# Patient Record
Sex: Male | Born: 1940 | Race: White | Hispanic: No | Smoking: Former smoker
Health system: Southern US, Community
[De-identification: ages and names within clinical notes are randomized; demographics above are authoritative.]

## PROBLEM LIST (undated history)

## (undated) DIAGNOSIS — M1990 Unspecified osteoarthritis, unspecified site: Secondary | ICD-10-CM

## (undated) DIAGNOSIS — J45909 Unspecified asthma, uncomplicated: Secondary | ICD-10-CM

## (undated) DIAGNOSIS — F419 Anxiety disorder, unspecified: Secondary | ICD-10-CM

## (undated) DIAGNOSIS — N529 Male erectile dysfunction, unspecified: Secondary | ICD-10-CM

## (undated) DIAGNOSIS — T7840XA Allergy, unspecified, initial encounter: Secondary | ICD-10-CM

## (undated) DIAGNOSIS — F32A Depression, unspecified: Secondary | ICD-10-CM

## (undated) DIAGNOSIS — N200 Calculus of kidney: Secondary | ICD-10-CM

## (undated) DIAGNOSIS — I10 Essential (primary) hypertension: Secondary | ICD-10-CM

## (undated) DIAGNOSIS — J449 Chronic obstructive pulmonary disease, unspecified: Secondary | ICD-10-CM

## (undated) DIAGNOSIS — H9190 Unspecified hearing loss, unspecified ear: Secondary | ICD-10-CM

## (undated) DIAGNOSIS — N189 Chronic kidney disease, unspecified: Secondary | ICD-10-CM

## (undated) DIAGNOSIS — F329 Major depressive disorder, single episode, unspecified: Secondary | ICD-10-CM

## (undated) HISTORY — DX: Essential (primary) hypertension: I10

## (undated) HISTORY — DX: Unspecified asthma, uncomplicated: J45.909

## (undated) HISTORY — DX: Calculus of kidney: N20.0

## (undated) HISTORY — PX: COLONOSCOPY: SHX174

## (undated) HISTORY — DX: Allergy, unspecified, initial encounter: T78.40XA

## (undated) HISTORY — DX: Major depressive disorder, single episode, unspecified: F32.9

## (undated) HISTORY — PX: TONSILLECTOMY: SUR1361

## (undated) HISTORY — DX: Anxiety disorder, unspecified: F41.9

## (undated) HISTORY — PX: POLYPECTOMY: SHX149

## (undated) HISTORY — DX: Depression, unspecified: F32.A

## (undated) HISTORY — DX: Unspecified hearing loss, unspecified ear: H91.90

## (undated) HISTORY — DX: Male erectile dysfunction, unspecified: N52.9

## (undated) HISTORY — DX: Unspecified osteoarthritis, unspecified site: M19.90

---

## 2009-09-13 ENCOUNTER — Emergency Department (HOSPITAL_COMMUNITY): Admission: EM | Admit: 2009-09-13 | Discharge: 2009-09-14 | Payer: Self-pay | Admitting: Emergency Medicine

## 2009-09-20 ENCOUNTER — Emergency Department (HOSPITAL_COMMUNITY): Admission: EM | Admit: 2009-09-20 | Discharge: 2009-09-20 | Payer: Self-pay | Admitting: Emergency Medicine

## 2009-10-03 ENCOUNTER — Emergency Department (HOSPITAL_COMMUNITY): Admission: EM | Admit: 2009-10-03 | Discharge: 2009-10-03 | Payer: Self-pay | Admitting: Emergency Medicine

## 2009-10-07 ENCOUNTER — Ambulatory Visit: Payer: Self-pay | Admitting: Family Medicine

## 2009-10-07 DIAGNOSIS — I10 Essential (primary) hypertension: Secondary | ICD-10-CM

## 2009-10-07 HISTORY — DX: Essential (primary) hypertension: I10

## 2009-10-07 LAB — CONVERTED CEMR LAB
AST: 26 units/L (ref 0–37)
Albumin: 4.8 g/dL (ref 3.5–5.2)
Alkaline Phosphatase: 60 units/L (ref 39–117)
BUN: 24 mg/dL — ABNORMAL HIGH (ref 6–23)
Basophils Absolute: 0 10*3/uL (ref 0.0–0.1)
Basophils Relative: 0.4 % (ref 0.0–3.0)
Bilirubin Urine: NEGATIVE
Bilirubin, Direct: 0.2 mg/dL (ref 0.0–0.3)
Calcium: 10.1 mg/dL (ref 8.4–10.5)
Cholesterol: 113 mg/dL (ref 0–200)
GFR calc non Af Amer: 52.91 mL/min (ref >60)
Glucose, Urine, Semiquant: NEGATIVE
HDL: 40.9 mg/dL (ref >39.00)
Hemoglobin: 17.3 g/dL — ABNORMAL HIGH (ref 13.0–17.0)
Ketones, urine, test strip: NEGATIVE
Monocytes Absolute: 0.6 10*3/uL (ref 0.1–1.0)
Monocytes Relative: 5.4 % (ref 3.0–12.0)
Neutro Abs: 8.1 10*3/uL — ABNORMAL HIGH (ref 1.4–7.7)
Protein, U semiquant: NEGATIVE
RDW: 13.6 % (ref 11.5–14.6)
Specific Gravity, Urine: 1.025
TSH: 8.35 microintl units/mL — ABNORMAL HIGH (ref 0.35–5.50)
Total Bilirubin: 1.1 mg/dL (ref 0.3–1.2)
Total CHOL/HDL Ratio: 3
Triglycerides: 109 mg/dL (ref 0.0–149.0)
VLDL: 21.8 mg/dL (ref 0.0–40.0)

## 2009-10-13 ENCOUNTER — Telehealth: Payer: Self-pay | Admitting: Family Medicine

## 2009-10-13 ENCOUNTER — Ambulatory Visit: Payer: Self-pay | Admitting: Family Medicine

## 2009-10-14 ENCOUNTER — Telehealth: Payer: Self-pay | Admitting: Family Medicine

## 2009-10-15 ENCOUNTER — Ambulatory Visit: Payer: Self-pay | Admitting: Family Medicine

## 2009-10-21 ENCOUNTER — Ambulatory Visit: Payer: Self-pay | Admitting: Internal Medicine

## 2009-10-27 ENCOUNTER — Encounter (INDEPENDENT_AMBULATORY_CARE_PROVIDER_SITE_OTHER): Payer: Self-pay | Admitting: *Deleted

## 2009-11-01 ENCOUNTER — Ambulatory Visit: Payer: Self-pay | Admitting: Gastroenterology

## 2009-11-19 ENCOUNTER — Ambulatory Visit: Payer: Self-pay | Admitting: Gastroenterology

## 2009-11-24 ENCOUNTER — Encounter: Payer: Self-pay | Admitting: Gastroenterology

## 2009-12-30 ENCOUNTER — Ambulatory Visit: Payer: Self-pay | Admitting: Family Medicine

## 2009-12-30 DIAGNOSIS — N529 Male erectile dysfunction, unspecified: Secondary | ICD-10-CM | POA: Insufficient documentation

## 2009-12-30 HISTORY — DX: Male erectile dysfunction, unspecified: N52.9

## 2010-04-26 NOTE — Assessment & Plan Note (Signed)
Summary: fu on bp per doc todd/njr   Vital Signs:  Neal profile:   70 year old male Weight:      191 pounds Temp:     97.9 degrees F oral BP sitting:   120 / 70  (left arm) Cuff size:   regular  Vitals Entered By: Duard Brady LPN (October 21, 2009 4:23 PM) CC: f/u on BP Is Neal Diabetic? No   CC:  f/u on BP.  History of Present Illness: Mitchell Neal who is seen today for follow-up of his hypertension.  He was seen earlier with accelerated hypertension and now is on multiple medications.  He has been monitoring  blood pressures closely at home with the excellent blood pressure control.  blood pressure readings.  This past week, and oriented low normal range.  During that time.  He occasionally felt fatigued, but had no significant orthostatic symptoms.  For the past few days he has felt well. He is maintained excellent blood pressure control.  He denies any cardiopulmonary complaints.  He is asking about the possibility of down titrating his medications  Allergies (verified): No Known Drug Allergies  Past History:  Past Medical History: Reviewed history from 10/07/2009 and no changes required. Hypertension SK, hx of kidney stones  Physical Exam  General:  Well-developed,well-nourished,in no acute distress; alert,appropriate and cooperative throughout examination; 136/80 Head:  Normocephalic and atraumatic without obvious abnormalities. No apparent alopecia or balding. Eyes:  No corneal or conjunctival inflammation noted. EOMI. Perrla. Funduscopic exam benign, without hemorrhages, exudates or papilledema. Vision grossly normal. Neck:  No deformities, masses, or tenderness noted. Lungs:  Normal respiratory effort, chest expands symmetrically. Lungs are clear to auscultation, no crackles or wheezes. Heart:  Normal rate and regular rhythm. S1 and S2 normal without gallop, murmur, click, rub or other extra sounds.   Impression & Recommendations:  Problem # 1:   HYPERTENSION (ICD-401.9)  His updated medication list for this problem includes:    Hydrochlorothiazide 25 Mg Tabs (Hydrochlorothiazide) .Marland Kitchen... Take one tab by mouth once daily    Labetalol Hcl 200 Mg Tabs (Labetalol hcl) .Marland Kitchen... Take 2  tablet by mouth two times a day    Norvasc 10 Mg Tabs (Amlodipine besylate) .Marland Kitchen... Take 1 tablet by mouth every morning    Cozaar 50 Mg Tabs (Losartan potassium) .Marland Kitchen... Take 1 tablet by mouth every morning  Complete Medication List: 1)  Hydrochlorothiazide 25 Mg Tabs (Hydrochlorothiazide) .... Take one tab by mouth once daily 2)  Vitamin C 250 Mg Tabs (Ascorbic acid) .... Take one tab by mouth once daily 3)  Qc Mens Daily Multivitamin Tabs (Multiple vitamins-minerals) .... Take one tab by mouth once daily 4)  Labetalol Hcl 200 Mg Tabs (Labetalol hcl) .... Take 2  tablet by mouth two times a day 5)  Norvasc 10 Mg Tabs (Amlodipine besylate) .... Take 1 tablet by mouth every morning 6)  Cozaar 50 Mg Tabs (Losartan potassium) .... Take 1 tablet by mouth every morning  Neal Instructions: 1)  Please schedule a follow-up appointment in 2 months. 2)  Limit your Sodium (Salt). 3)  It is important that you exercise regularly at least 20 minutes 5 times a week. If you develop chest pain, have severe difficulty breathing, or feel very tired , stop exercising immediately and seek medical attention. 4)  You need to lose weight. Consider a lower calorie diet and regular exercise.  5)  Check your Blood Pressure regularly. If it is above: 160/90 or lower than 100/65  you should make an appointment.

## 2010-04-26 NOTE — Assessment & Plan Note (Signed)
Summary: BRAND NEW PT/ TO EST/CJR   Vital Signs:  Patient profile:   70 year old male Height:      69.5 inches Weight:      191 pounds BMI:     27.90 Temp:     98.6 degrees F oral BP sitting:   190 / 110  (left arm) Cuff size:   regular  Vitals Entered By: Kern Reap CMA Duncan Dull) (October 07, 2009 8:39 AM) CC: new to establish, HTN Is Patient Diabetic? No Pain Assessment Patient in pain? no        CC:  new to establish and HTN.  History of Present Illness: Osborne is a 70 year old single male ex smoker x 4 weeks and comes in today on referral from the emergency room.  Dr. Ignacia Palma for evaluation of hypertension.  He states he went to the emergency room on June 20 with severe back pain.  He was diagnosed to have a kidney stone, which he passed spontaneously.  They also noted.  His blood pressure was elevated and start him on labetalol 100 mg b.i.d. and Inocor thiazide 25 mg daily.  BP after 3 weeks of therapy 190/110.... obviously not at goal.  Review of systems negative.  Last tetanus booster unknown  Preventive Screening-Counseling & Management      Drug Use:  no.    Allergies (verified): No Known Drug Allergies  Past History:  Past medical, surgical, family and social histories (including risk factors) reviewed, and no changes noted (except as noted below).  Past Medical History: Hypertension SK, hx of kidney stones  Past Surgical History: Tonsillectomy  Family History: Reviewed history and no changes required. Father: deceased....88.....smoker Mother: deceased..........101 Siblings: -2.sisters ok  Social History: Reviewed history and no changes required. Occupation: Contractor Divorced Alcohol use-no Drug use-no Drug Use:  no  Review of Systems      See HPI  Physical Exam  General:  Well-developed,well-nourished,in no acute distress; alert,appropriate and cooperative throughout examination Head:  Normocephalic and atraumatic without obvious  abnormalities. No apparent alopecia or balding. Eyes:  No corneal or conjunctival inflammation noted. EOMI. Perrla. Funduscopic exam benign, without hemorrhages, exudates or papilledema. Vision grossly normal. Ears:  External ear exam shows no significant lesions or deformities.  Otoscopic examination reveals clear canals, tympanic membranes are intact bilaterally without bulging, retraction, inflammation or discharge. Hearing is grossly normal bilaterally. Nose:  External nasal examination shows no deformity or inflammation. Nasal mucosa are pink and moist without lesions or exudates. Mouth:  Oral mucosa and oropharynx without lesions or exudates.  Teeth in good repair. Neck:  No deformities, masses, or tenderness noted. Chest Wall:  No deformities, masses, tenderness or gynecomastia noted. Breasts:  No masses or gynecomastia noted Lungs:  Normal respiratory effort, chest expands symmetrically. Lungs are clear to auscultation, no crackles or wheezes. Heart:  Normal rate and regular rhythm. S1 and S2 normal without gallop, murmur, click, rub or other extra sounds. Abdomen:  Bowel sounds positive,abdomen soft and non-tender without masses, organomegaly or hernias noted..........ventral hernia from the umbilicus to the xiphoid.  2 inches in gap Rectal:  No external abnormalities noted. Normal sphincter tone. No rectal masses or tenderness. Genitalia:  Testes bilaterally descended without nodularity, tenderness or masses. No scrotal masses or lesions. No penis lesions or urethral discharge. Prostate:  Prostate gland firm and smooth, no enlargement, nodularity, tenderness, mass, asymmetry or induration. Msk:  No deformity or scoliosis noted of thoracic or lumbar spine.   Pulses:  R and L carotid,radial,femoral,dorsalis pedis  and posterior tibial pulses are full and equal bilaterally Extremities:  No clubbing, cyanosis, edema, or deformity noted with normal full range of motion of all joints.     Neurologic:  No cranial nerve deficits noted. Station and gait are normal. Plantar reflexes are down-going bilaterally. DTRs are symmetrical throughout. Sensory, motor and coordinative functions appear intact. Skin:  Intact without suspicious lesions or rashes Cervical Nodes:  No lymphadenopathy noted Axillary Nodes:  No palpable lymphadenopathy Inguinal Nodes:  No significant adenopathy Psych:  Cognition and judgment appear intact. Alert and cooperative with normal attention span and concentration. No apparent delusions, illusions, hallucinations   Impression & Recommendations:  Problem # 1:  HYPERTENSION (ICD-401.9) Assessment New  His updated medication list for this problem includes:    Hydrochlorothiazide 25 Mg Tabs (Hydrochlorothiazide) .Marland Kitchen... Take one tab by mouth once daily    Norvasc 5 Mg Tabs (Amlodipine besylate) .Marland Kitchen... Take 1 tablet by mouth every morning    Labetalol Hcl 200 Mg Tabs (Labetalol hcl) .Marland Kitchen... Take 1 tablet by mouth two times a day  Orders: Venipuncture (60454) Prescription Created Electronically [id number]) UA Dipstick w/o Micro (automated)  (81003) EKG w/ Interpretation (93000) TLB-Lipid Panel (80061-LIPID) TLB-BMP (Basic Metabolic Panel-BMET) (80048-METABOL) TLB-CBC Platelet - w/Differential (85025-CBCD) TLB-Hepatic/Liver Function Pnl (80076-HEPATIC) TLB-TSH (Thyroid Stimulating Hormone) (84443-TSH)  Problem # 2:  Preventive Health Care (ICD-V70.0) Assessment: New  Complete Medication List: 1)  Hydrochlorothiazide 25 Mg Tabs (Hydrochlorothiazide) .... Take one tab by mouth once daily 2)  Vitamin C 250 Mg Tabs (Ascorbic acid) .... Take one tab by mouth once daily 3)  Qc Mens Daily Multivitamin Tabs (Multiple vitamins-minerals) .... Take one tab by mouth once daily 4)  Norvasc 5 Mg Tabs (Amlodipine besylate) .... Take 1 tablet by mouth every morning 5)  Labetalol Hcl 200 Mg Tabs (Labetalol hcl) .... Take 1 tablet by mouth two times a day  Other Orders: TD  Toxoids IM 7 YR + (91478) Admin 1st Vaccine (29562) Gastroenterology Referral (GI)  Patient Instructions: 1)  increase the labetalol to two tabs twice daily, continue the HCTZ, 25 mg q.a.m., add Norvasc 5 mg q.a.m. 2)  Check a BP daily.  Return in one week with all your blood pressure readings and the device 3)  Schedule a colonoscopy/sigmoidoscopy to help detect colon cancer. 4)  Take an Aspirin every day. Prescriptions: HYDROCHLOROTHIAZIDE 25 MG TABS (HYDROCHLOROTHIAZIDE) take one tab by mouth once daily  #100 x 3   Entered and Authorized by:   Roderick Pee MD   Signed by:   Roderick Pee MD on 10/07/2009   Method used:   Electronically to        [id number]* (retail)       [address]       [place], [place]  [address]       Ph: [phone] or [phone]       Fax: [phone]   RxID:   [id number] LABETALOL HCL 200 MG TABS (LABETALOL HCL) Take 1 tablet by mouth two times a day  #200 x 3   Entered and Authorized by:   Roderick Pee MD   Signed by:   Roderick Pee MD on 10/07/2009   Method used:   Electronically to        [facility]  [address] [id number]* (retail)       [address]       [facility]  [place], [place]  [address]       Ph: [phone] or [phone]       Fax: [phone]   RxID:   [id number] NORVASC 5 MG TABS (AMLODIPINE BESYLATE) Take 1 tablet by mouth every morning  #100 x 3   Entered and Authorized by:   Roderick Pee MD   Signed by:   Roderick Pee MD on 10/07/2009   Method used:   Electronically to        [id number]* (retail)       [address]       [place], [place]  [address]       Ph: [phone] or [phone]       Fax: [phone]   RxID:   [id number]   Laboratory Results   Urine Tests    Routine Urinalysis   Color: yellow Appearance: Clear Glucose: negative   (Normal Range: Negative) Bilirubin: negative   (Normal Range:  Negative) Ketone: negative   (Normal Range: Negative) Spec. Gravity: 1.025   (Normal Range: 1.003-1.035) Blood: negative   (Normal Range: Negative) pH: 6.0   (Normal Range: 5.0-8.0) Protein: negative   (Normal Range: Negative) Urobilinogen: 0.2   (Normal Range: 0-1) Nitrite: negative   (Normal Range: Negative) Leukocyte Esterace: negative   (Normal Range: Negative)    Comments: Rita Ohara  October 07, 2009 10:03 AM       Immunizations Administered:  Tetanus Vaccine:    Vaccine Type: Td    Site: left deltoid    Mfr: Sanofi Pasteur    Dose: 0.5 ml    Route: IM    Given by: Kern Reap CMA (AAMA)    Exp. Date: 04/22/2011    Lot #: [id number]    Physician counseled: yes

## 2010-04-26 NOTE — Assessment & Plan Note (Signed)
Summary: fu friday/njr   Vital Signs:  Patient profile:   70 year old male BP sitting:   130 / 72  (left arm) Cuff size:   regular  Vitals Entered By: Kern Reap CMA Duncan Dull) (October 15, 2009 12:11 PM)  History of Present Illness: Mitchell Neal is a 70 year old male, who comes back today for follow-up of severe hypertension.  We saw him on July 14 with market.  Elevation of his blood pressure 200/120.  We placed and a complete bed rest, increased his Norvasc to 10 mg a day, increased his labetalol to 600 mg b.i.d. added 50 mg a Cozaar daily.  He continues to take the diuretic one daily.  BP today, now 130/72.  No hypotension.  He is not lightheaded when he stands up.  Allergies: No Known Drug Allergies  Past History:  Past medical, surgical, family and social histories (including risk factors) reviewed for relevance to current acute and chronic problems.  Past Medical History: Reviewed history from 10/07/2009 and no changes required. Hypertension SK, hx of kidney stones  Past Surgical History: Reviewed history from 10/07/2009 and no changes required. Tonsillectomy  Family History: Reviewed history from 10/07/2009 and no changes required. Father: deceased....88.....smoker Mother: deceased..........101 Siblings: -2.sisters ok  Social History: Reviewed history from 10/07/2009 and no changes required. Occupation: Contractor Divorced Alcohol use-no Drug use-no  Review of Systems      See HPI  Physical Exam  General:  Well-developed,well-nourished,in no acute distress; alert,appropriate and cooperative throughout examination Heart:  130/80   Impression & Recommendations:  Problem # 1:  HYPERTENSION (ICD-401.9) Assessment Improved  The following medications were removed from the medication list:    Norvasc 5 Mg Tabs (Amlodipine besylate) .Marland Kitchen... Take 1 tablet by mouth every morning His updated medication list for this problem includes:    Hydrochlorothiazide 25 Mg Tabs  (Hydrochlorothiazide) .Marland Kitchen... Take one tab by mouth once daily    Labetalol Hcl 200 Mg Tabs (Labetalol hcl) .Marland Kitchen... Take 3  tablet by mouth two times a day    Norvasc 10 Mg Tabs (Amlodipine besylate) .Marland Kitchen... Take 1 tablet by mouth every morning    Cozaar 50 Mg Tabs (Losartan potassium) .Marland Kitchen... Take 1 tablet by mouth every morning  Orders: Prescription Created Electronically [id number])  Complete Medication List: 1)  Hydrochlorothiazide 25 Mg Tabs (Hydrochlorothiazide) .... Take one tab by mouth once daily 2)  Vitamin C 250 Mg Tabs (Ascorbic acid) .... Take one tab by mouth once daily 3)  Qc Mens Daily Multivitamin Tabs (Multiple vitamins-minerals) .... Take one tab by mouth once daily 4)  Labetalol Hcl 200 Mg Tabs (Labetalol hcl) .... Take 3  tablet by mouth two times a day 5)  Norvasc 10 Mg Tabs (Amlodipine besylate) .... Take 1 tablet by mouth every morning 6)  Cozaar 50 Mg Tabs (Losartan potassium) .... Take 1 tablet by mouth every morning  Patient Instructions: 1)  continued to take it your current medications daily.  Ambulate and resume y  normal activities.  Check a morning blood pressure daily.  Return to see Dr. Kirtland Bouchard. next Thursday afternoon for follow-up Prescriptions: HYDROCHLOROTHIAZIDE 25 MG TABS (HYDROCHLOROTHIAZIDE) take one tab by mouth once daily  #100 x 3   Entered and Authorized by:   Roderick Pee MD   Signed by:   Roderick Pee MD on 10/15/2009   Method used:   Electronically to        [facility]  [address] [id number]* (retail)  [address]       [place], [place]  [address]       Ph: [phone] or [phone]       Fax: [phone]   RxID:   [id number]

## 2010-04-26 NOTE — Progress Notes (Signed)
Summary: Elevated BP  Phone Note Call from Patient Call back at [facility] Phone [phone]   Caller: Patient Summary of Call: This morning woke up with a lot of weakness in legs, not feeling dizzy.  BP reading was 196/88.  Please have nurse call me back. Initial call taken by: Trixie Dredge,  October 13, 2009 9:10 AM  Follow-up for Phone Call        patient coming in for office visit Follow-up by: Kern Reap CMA Duncan Dull),  October 13, 2009 9:29 AM

## 2010-04-26 NOTE — Assessment & Plan Note (Signed)
Summary: elevated blood pressure - rv   Vital Signs:  Patient profile:   70 year old male Weight:      191 pounds Temp:     98.3 degrees F oral BP sitting:   200 / 100  (left arm) Cuff size:   regular  Vitals Entered By: Kern Reap CMA Duncan Dull) (October 13, 2009 10:05 AM) CC: elevated blood pressure   CC:  elevated blood pressure.  History of Present Illness: Mitchell Neal is a 70 year old male, who comes in today for evaluation of hypertension.  We saw him a week or two ago, and increased his beta blocker to 4 mg b.i.d. however, his blood pressure has not dropped at all.  Today is 190/90 right arm sitting position.  Is asymptomatic.  Labs done couple weeks ago, normal except for GFR 52  Allergies: No Known Drug Allergies PMH-FH-SH reviewed for relevance  Review of Systems      See HPI  Physical Exam  General:  Well-developed,well-nourished,in no acute distress; alert,appropriate and cooperative throughout examination Heart:  190/90 right arm sitting position   Impression & Recommendations:  Problem # 1:  HYPERTENSION (ICD-401.9) Assessment Unchanged  His updated medication list for this problem includes:    Hydrochlorothiazide 25 Mg Tabs (Hydrochlorothiazide) .Marland Kitchen... Take one tab by mouth once daily    Norvasc 5 Mg Tabs (Amlodipine besylate) .Marland Kitchen... Take 1 tablet by mouth every morning    Labetalol Hcl 200 Mg Tabs (Labetalol hcl) .Marland Kitchen... Take 3  tablet by mouth two times a day    Norvasc 10 Mg Tabs (Amlodipine besylate) .Marland Kitchen... Take 1 tablet by mouth every morning    Cozaar 50 Mg Tabs (Losartan potassium) .Marland Kitchen... Take 1 tablet by mouth every morning  Orders: Prescription Created Electronically [phone])  Complete Medication List: 1)  Hydrochlorothiazide 25 Mg Tabs (Hydrochlorothiazide) .... Take one tab by mouth once daily 2)  Vitamin C 250 Mg Tabs (Ascorbic acid) .... Take one tab by mouth once daily 3)  Qc Mens Daily Multivitamin Tabs (Multiple vitamins-minerals) .... Take one  tab by mouth once daily 4)  Norvasc 5 Mg Tabs (Amlodipine besylate) .... Take 1 tablet by mouth every morning 5)  Labetalol Hcl 200 Mg Tabs (Labetalol hcl) .... Take 3  tablet by mouth two times a day 6)  Norvasc 10 Mg Tabs (Amlodipine besylate) .... Take 1 tablet by mouth every morning 7)  Cozaar 50 Mg Tabs (Losartan potassium) .... Take 1 tablet by mouth every morning  Patient Instructions: 1)  increase the Norvasc to 10 mg daily, increase the labetalol to 600 mg twice daily, add Cozaar 50 mg daily, and continue the hydrochlorothiazide 25 mg in the morning. 2)  Stay at complete bed rest all day today, and all day tomorrow.  Return on Friday for follow-up.  When you return on Friday bring a record of all your blood pressure readings 3 times a day 3)  also remember no salt and drink 30 ounces of water daily Prescriptions: COZAAR 50 MG TABS (LOSARTAN POTASSIUM) Take 1 tablet by mouth every morning  #30 x 2   Entered and Authorized by:   Roderick Pee MD   Signed by:   Roderick Pee MD on 10/13/2009   Method used:   Electronically to        [facility]  [address] [id number]* (retail)       [address]       [facility] [place], [place]  [address]       Ph: [phone] or [phone]       Fax: [phone]   RxID:   [id number] NORVASC 10 MG TABS (AMLODIPINE BESYLATE) Take 1 tablet by mouth every morning  #100 x 3   Entered and Authorized by:   Roderick Pee MD   Signed by:   Roderick Pee MD on 10/13/2009   Method used:   Electronically to        [facility]  [address] [id number]* (retail)       [address]       [place], [place]  [address]       Ph: [phone] or [phone]       Fax: [phone]   RxID:   [id number] LABETALOL HCL 200 MG TABS (LABETALOL HCL) Take 3  tablet by mouth two times a day  #600 x 3   Entered and Authorized by:   Roderick Pee MD   Signed by:   Roderick Pee MD on 10/13/2009   Method used:   Electronically to         [facility]  [address] [id number]* (retail)       [address]       [place], [place]  [address]       Ph: [phone] or [phone]       Fax: [phone]   RxID:   [id number]

## 2010-04-26 NOTE — Letter (Signed)
Summary: Patient Notice- Polyp Results  [facility]  [address] [place], [place] [address]   Phone: [phone]  Fax: [phone]        November 24, 2009 MRN: [id number]    MACARIO SHEAR [address] [place], [place]  [address]    Dear Mr. CHRISTIANA,  I am pleased to inform you that the colon polyp(s) removed during your recent colonoscopy was (were) found to be benign (no cancer detected) upon pathologic examination.  I recommend you have a repeat colonoscopy examination in 3_ years to look for recurrent polyps, as having colon polyps increases your risk for having recurrent polyps or even colon cancer in the future.  Should you develop new or worsening symptoms of abdominal pain, bowel habit changes or bleeding from the rectum or bowels, please schedule an evaluation with either your primary care physician or with me.  Additional information/recommendations:  X__ No further action with gastroenterology is needed at this time. Please      follow-up with your primary care physician for your other healthcare      needs.  __ Please call [phone] to schedule a return visit to review your      situation.  __ Please keep your follow-up visit as already scheduled.  __ Continue treatment plan as outlined the day of your exam.  Please call us if you are having persistent problems or have questions about your condition that have not been fully answered at this time.  Sincerely,  Mardella Layman MD [facility]  This letter has been electronically signed by your physician.  Appended Document: Patient Notice- Polyp Results letter mailed 9.2.11

## 2010-04-26 NOTE — Progress Notes (Signed)
Summary: Phone call  Phone Note Outgoing Call   Call placed by: Kathrynn Speed CMA,  October 14, 2009 4:31 PM Summary of Call: Called pt to see why he did not show up for his apt, he said this apt was to be cancelled; because he is coming in the office [place].  Checked apt is [place] at 12:30pm Initial call taken by: Kathrynn Speed CMA,  October 14, 2009 4:31 PM

## 2010-04-26 NOTE — Assessment & Plan Note (Signed)
Summary: follow up bp and med/discuss colonoscopy results/cjr   Vital Signs:  Patient profile:   70 year old male Weight:      196 pounds Temp:     98.3 degrees F oral BP sitting:   130 / 78  (left arm) Cuff size:   regular  Vitals Entered By: Kern Reap CMA Duncan Dull) (December 30, 2009 10:47 AM) CC: follow-up visit   CC:  follow-up visit.  History of Present Illness: Mitchell Neal is a 70 year old male, who comes in today for evaluation of two problems.  His blood pressure is too low on his medications.  He takes hydrochlorothiazide 25 mg q.a.m., Norvasc, 10 mg q.a.m., Cozaar, 50 mg q.a.m., labetalol 4 mg b.i.d.  Blood pressure home is averaging 120/60 in the morning.  He has an hour or two where he is lightheaded.  He will also like some Viagra.  He is having trouble with erectile dysfunction.  He is a nonsmoker  Allergies: No Known Drug Allergies  Past History:  Past medical, surgical, family and social histories (including risk factors) reviewed for relevance to current acute and chronic problems.  Past Medical History: Reviewed history from 10/07/2009 and no changes required. Hypertension SK, hx of kidney stones  Past Surgical History: Reviewed history from 10/07/2009 and no changes required. Tonsillectomy  Family History: Reviewed history from 10/07/2009 and no changes required. Father: deceased....88.....smoker Mother: deceased..........89 Siblings: -2.sisters ok  Social History: Reviewed history from 10/07/2009 and no changes required. Occupation: Contractor Divorced Alcohol use-no Drug use-no  Review of Systems      See HPI  Physical Exam  General:  Well-developed,well-nourished,in no acute distress; alert,appropriate and cooperative throughout examination   Impression & Recommendations:  Problem # 1:  HYPERTENSION (ICD-401.9) Assessment Improved  His updated medication list for this problem includes:    Hydrochlorothiazide 25 Mg Tabs  (Hydrochlorothiazide) .Marland Kitchen... Take one tab by mouth once daily    Labetalol Hcl 200 Mg Tabs (Labetalol hcl) .Marland Kitchen... Take 2  tablet by mouth two times a day    Norvasc 10 Mg Tabs (Amlodipine besylate) .Marland Kitchen... Take 1 tablet by mouth every morning    Cozaar 50 Mg Tabs (Losartan potassium) .Marland Kitchen... Take 1 tablet by mouth every morning  Problem # 2:  ERECTILE DYSFUNCTION, ORGANIC (ICD-607.84) Assessment: New  His updated medication list for this problem includes:    Viagra 50 Mg Tabs (Sildenafil citrate) ..... Uad  Complete Medication List: 1)  Hydrochlorothiazide 25 Mg Tabs (Hydrochlorothiazide) .... Take one tab by mouth once daily 2)  Vitamin C 250 Mg Tabs (Ascorbic acid) .... Take one tab by mouth once daily 3)  Qc Mens Daily Multivitamin Tabs (Multiple vitamins-minerals) .... Take one tab by mouth once daily 4)  Labetalol Hcl 200 Mg Tabs (Labetalol hcl) .... Take 2  tablet by mouth two times a day 5)  Norvasc 10 Mg Tabs (Amlodipine besylate) .... Take 1 tablet by mouth every morning 6)  Cozaar 50 Mg Tabs (Losartan potassium) .... Take 1 tablet by mouth every morning 7)  Viagra 50 Mg Tabs (Sildenafil citrate) .... Uad  Patient Instructions: 1)  decreased the labetalol, take one tablet twice daily.  Continue your other blood pressure medications.  Check y  blood pressure daily.  If in 4 weeks y  blood pressure is still too low.  Call me. 2)  Viagra 50 mg directions one half tab, one hour prior to sex with water 3)  Return in July 2012 ......... annual physical exam Prescriptions: VIAGRA 50  MG TABS (SILDENAFIL CITRATE) UAD  #6 x 11   Entered and Authorized by:   Roderick Pee MD   Signed by:   Roderick Pee MD on 12/30/2009   Method used:   Electronically to        [facility] [id number] (retail)       [address]       [place], [place]  [address]       Ph: [phone] or [phone]       Fax: [phone]   RxID:   [id number]

## 2010-04-26 NOTE — Miscellaneous (Signed)
Summary: DIR COL...AS.  Clinical Lists Changes  Medications: Added new medication of MOVIPREP 100 GM  SOLR (PEG-KCL-NACL-NASULF-NA ASC-C) As directed - Signed Rx of MOVIPREP 100 GM  SOLR (PEG-KCL-NACL-NASULF-NA ASC-C) As directed;  #1 x 0;  Signed;  Entered by: Clide Cliff RN;  Authorized by: Mardella Layman MD [facility];  Method used: Electronically to [facility] [address] [facility] [id number]*, [address] [place], [place], [place]  [address], Ph: [phone] or [phone], Fax: [phone] Observations: Added new observation of ALLERGY REV: Done (11/01/2009 8:01)    Prescriptions: MOVIPREP 100 GM  SOLR (PEG-KCL-NACL-NASULF-NA ASC-C) As directed  #1 x 0   Entered by:   Clide Cliff RN   Authorized by:   Mardella Layman MD [facility]   Signed by:   Clide Cliff RN on 11/01/2009   Method used:   Electronically to        [id number]* (retail)       [address]       [place], [place]  [address]       Ph: [phone] or [phone]       Fax: [phone]   RxID:   [id number]

## 2010-04-26 NOTE — Letter (Signed)
Summary: [facility] Instructions  [facility]  [address] [place], [place] [address]   Phone: [phone]  Fax: [phone]       Mitchell Neal    08-30-1940    MRN: [id number]        Procedure Day /Date:   Friday  11/19/09     Arrival Time:  12:30pm     Procedure Time:  1:30 pm     Location of Procedure:                    _ X_  [facility] (4th Floor)                        PREPARATION FOR COLONOSCOPY WITH MOVIPREP   Starting 5 days prior to your procedure 11/14/09 do not eat nuts, seeds, popcorn, corn, beans, peas,  salads, or any raw vegetables.  Do not take any fiber supplements (e.g. Metamucil, Citrucel, and Benefiber).  THE DAY BEFORE YOUR PROCEDURE         DATE: 11/18/09  DAY: Thursday  1.  Drink clear liquids the entire day-NO SOLID FOOD  2.  Do not drink anything colored red or purple.  Avoid juices with pulp.  No orange juice.  3.  Drink at least 64 oz. (8 glasses) of fluid/clear liquids during the day to prevent dehydration and help the prep work efficiently.  CLEAR LIQUIDS INCLUDE: Water Jello Ice Popsicles Tea (sugar ok, no milk/cream) Powdered fruit flavored drinks Coffee (sugar ok, no milk/cream) Gatorade Juice: apple, white grape, white cranberry  Lemonade Clear bullion, consomm, broth Carbonated beverages (any kind) Strained chicken noodle soup Hard Candy                             4.  In the morning, mix first dose of MoviPrep solution:    Empty 1 Pouch A and 1 Pouch B into the disposable container    Add lukewarm drinking water to the top line of the container. Mix to dissolve    Refrigerate (mixed solution should be used within 24 hrs)  5.  Begin drinking the prep at 5:00 p.m. The MoviPrep container is divided by 4 marks.   Every 15 minutes drink the solution down to the next mark (approximately 8 oz) until the full liter is complete.   6.  Follow completed prep with 16 oz of clear liquid of your choice  (Nothing red or purple).  Continue to drink clear liquids until bedtime.  7.  Before going to bed, mix second dose of MoviPrep solution:    Empty 1 Pouch A and 1 Pouch B into the disposable container    Add lukewarm drinking water to the top line of the container. Mix to dissolve    Refrigerate  THE DAY OF YOUR PROCEDURE      DATE: 11/19/09  DAY: Friday  Beginning at  8:30AM (5 hours before procedure):         1. Every 15 minutes, drink the solution down to the next mark (approx 8 oz) until the full liter is complete.  2. Follow completed prep with 16 oz. of clear liquid of your choice.    3. You may drink clear liquids until 11:30 AM (2 HOURS BEFORE PROCEDURE).   MEDICATION INSTRUCTIONS  Unless otherwise instructed, you should take regular prescription medications with a small sip of water   as early  as possible the morning of your procedure.   Additional medication instructions: _HOLD YOUR HCTZ THE AM OF YOUR PROCEDURE         OTHER INSTRUCTIONS  You will need a responsible adult at least 70 years of age to accompany you and drive you home.   This person must remain in the waiting room during your procedure.  Wear loose fitting clothing that is easily removed.  Leave jewelry and other valuables at home.  However, you may wish to bring a book to read or  an iPod/MP3 player to listen to music as you wait for your procedure to start.  Remove all body piercing jewelry and leave at home.  Total time from sign-in until discharge is approximately 2-3 hours.  You should go home directly after your procedure and rest.  You can resume normal activities the  day after your procedure.  The day of your procedure you should not:   Drive   Make legal decisions   Operate machinery   Drink alcohol   Return to work  You will receive specific instructions about eating, activities and medications before you leave.    The above instructions have been reviewed and  explained to me by   Clide Cliff, RN______________________    I fully understand and can verbalize these instructions _____________________________ Date _________

## 2010-04-26 NOTE — Procedures (Signed)
Summary: Colonoscopy  Patient: Mitchell Neal Note: All result statuses are Final unless otherwise noted.  Tests: (1) Colonoscopy (COL)   COL Colonoscopy           DONE     [facility]     [address]     [place], [place]  [address]           COLONOSCOPY PROCEDURE REPORT           PATIENT:  [name], [name]  MR#:  [id number]     BIRTHDATE:  05-23-1940, 69 yrs. old  GENDER:  male     ENDOSCOPIST:  Vania Rea. Jarold Motto, MD, [facility]     REF. BY:  Tinnie Gens A. Tawanna Cooler, M.D.     PROCEDURE DATE:  11/19/2009     PROCEDURE:  Colonoscopy with snare polypectomy     ASA CLASS:  Class II     INDICATIONS:  Routine Risk Screening     MEDICATIONS:   Fentanyl 50 mcg IV, Versed 7 mg IV           DESCRIPTION OF PROCEDURE:   After the risks benefits and     alternatives of the procedure were thoroughly explained, informed     consent was obtained.  Digital rectal exam was performed and     revealed no abnormalities.   The LB CF-[id number] endoscope     was introduced through the anus and advanced to the cecum, which     was identified by both the appendix and ileocecal valve, without     limitations.  The quality of the prep was excellent, using     MoviPrep.  The instrument was then slowly withdrawn as the colon     was fully examined.     <<PROCEDUREIMAGES>>           FINDINGS:  A sessile polyp was found. 5mm flat polyp at hepatic     hot snare excised.no tissue.  A sessile polyp was found at the     splenic flexure. It was fleshy and large. Polyps were snared, then     cauterized with monopolar cautery. Retrieval was successful. snare     polyp piecemeal excised.  Moderate diverticulosis was found in the     sigmoid to descending colon segments.   Retroflexed views in the     rectum revealed no abnormalities.    The scope was then withdrawn     from the patient and the procedure completed.           COMPLICATIONS:  None     ENDOSCOPIC IMPRESSION:     1) Sessile polyp     2) Sessile  polyp at the splenic flexure     3) Moderate diverticulosis in the sigmoid to descending colon     segments     R/O ADENOMAS.     RECOMMENDATIONS:     1) Await biopsy results     2) High fiber diet.     F/U 1-3 YEARS     REPEAT EXAM:  No           ______________________________     Vania Rea. Jarold Motto, MD, [facility]           CC:           n.     eSIGNED:   Vania Rea. Patterson at 11/19/2009 02:06 PM           Pietro Cassis, [id number]  Note: An exclamation mark Marland Kitchen)  indicates a result that was not dispersed into the flowsheet. Document Creation Date: 11/19/2009 2:07 PM _______________________________________________________________________  (1) Order result status: Final Collection or observation date-time: 11/19/2009 13:59 Requested date-time:  Receipt date-time:  Reported date-time:  Referring Physician:   Ordering Physician: Sheryn Bison [phone]) Specimen Source:  Source: Launa Grill Order Number: [id number] Lab site:   Appended Document: Colonoscopy     Procedures Next Due Date:    Colonoscopy: 10/2012

## 2010-06-12 LAB — URINALYSIS, ROUTINE W REFLEX MICROSCOPIC
Glucose, UA: NEGATIVE mg/dL
Nitrite: NEGATIVE
Protein, ur: 100 mg/dL — AB
Specific Gravity, Urine: 1.036 — ABNORMAL HIGH (ref 1.005–1.030)
Urobilinogen, UA: 1 mg/dL (ref 0.0–1.0)
pH: 5 (ref 5.0–8.0)

## 2010-06-12 LAB — COMPREHENSIVE METABOLIC PANEL
BUN: 22 mg/dL (ref 6–23)
CO2: 28 mEq/L (ref 19–32)
Sodium: 144 mEq/L (ref 135–145)
Total Bilirubin: 0.8 mg/dL (ref 0.3–1.2)

## 2010-06-12 LAB — POCT CARDIAC MARKERS: CKMB, poc: 2 ng/mL (ref 1.0–8.0)

## 2010-06-12 LAB — CBC
HCT: 51.2 % (ref 39.0–52.0)
MCV: 90.4 fL (ref 78.0–100.0)
Platelets: 240 10*3/uL (ref 150–400)
RDW: 14 % (ref 11.5–15.5)
WBC: 15.4 10*3/uL — ABNORMAL HIGH (ref 4.0–10.5)

## 2010-06-12 LAB — DIFFERENTIAL
Basophils Absolute: 0 10*3/uL (ref 0.0–0.1)
Basophils Relative: 0 % (ref 0–1)
Eosinophils Relative: 0 % (ref 0–5)
Monocytes Absolute: 0.9 10*3/uL (ref 0.1–1.0)
Monocytes Relative: 6 % (ref 3–12)
Neutro Abs: 12.8 10*3/uL — ABNORMAL HIGH (ref 1.7–7.7)

## 2010-06-12 LAB — URINE MICROSCOPIC-ADD ON

## 2010-10-07 ENCOUNTER — Other Ambulatory Visit: Payer: Self-pay

## 2010-10-13 ENCOUNTER — Encounter: Payer: Self-pay | Admitting: Family Medicine

## 2010-10-14 ENCOUNTER — Encounter: Payer: Self-pay | Admitting: Family Medicine

## 2010-10-25 ENCOUNTER — Other Ambulatory Visit: Payer: Self-pay | Admitting: Family Medicine

## 2010-11-09 ENCOUNTER — Encounter: Payer: Self-pay | Admitting: Family Medicine

## 2010-11-10 ENCOUNTER — Ambulatory Visit (INDEPENDENT_AMBULATORY_CARE_PROVIDER_SITE_OTHER)
Admission: RE | Admit: 2010-11-10 | Discharge: 2010-11-10 | Disposition: A | Discharge status: Home / Self Care | Payer: Medicare Other | Source: Ambulatory Visit | Attending: Family Medicine | Admitting: Family Medicine

## 2010-11-10 ENCOUNTER — Other Ambulatory Visit: Payer: Self-pay | Admitting: Family Medicine

## 2010-11-10 ENCOUNTER — Encounter: Payer: Self-pay | Admitting: Family Medicine

## 2010-11-10 ENCOUNTER — Ambulatory Visit (INDEPENDENT_AMBULATORY_CARE_PROVIDER_SITE_OTHER): Payer: Medicare Other | Admitting: Family Medicine

## 2010-11-10 DIAGNOSIS — N529 Male erectile dysfunction, unspecified: Secondary | ICD-10-CM

## 2010-11-10 DIAGNOSIS — F17200 Nicotine dependence, unspecified, uncomplicated: Secondary | ICD-10-CM

## 2010-11-10 DIAGNOSIS — Z720 Tobacco use: Secondary | ICD-10-CM

## 2010-11-10 DIAGNOSIS — Z23 Encounter for immunization: Secondary | ICD-10-CM

## 2010-11-10 DIAGNOSIS — Z0000 Encounter for general adult medical examination without abnormal findings: Secondary | ICD-10-CM

## 2010-11-10 DIAGNOSIS — Z125 Encounter for screening for malignant neoplasm of prostate: Secondary | ICD-10-CM

## 2010-11-10 DIAGNOSIS — I10 Essential (primary) hypertension: Secondary | ICD-10-CM

## 2010-11-10 DIAGNOSIS — J449 Chronic obstructive pulmonary disease, unspecified: Secondary | ICD-10-CM

## 2010-11-10 LAB — PSA: PSA: 0.82 ng/mL (ref 0.10–4.00)

## 2010-11-10 LAB — CBC WITH DIFFERENTIAL/PLATELET
Eosinophils Relative: 1.9 % (ref 0.0–5.0)
Lymphs Abs: 2 10*3/uL (ref 0.7–4.0)
MCHC: 33.7 g/dL (ref 30.0–36.0)
Neutrophils Relative %: 67.1 % (ref 43.0–77.0)
Platelets: 266 10*3/uL (ref 150.0–400.0)
RBC: 5.22 Mil/uL (ref 4.22–5.81)
RDW: 14.1 % (ref 11.5–14.6)
WBC: 8.7 10*3/uL (ref 4.5–10.5)

## 2010-11-10 LAB — POCT URINALYSIS DIPSTICK
Bilirubin, UA: NEGATIVE
Spec Grav, UA: 1.01
Urobilinogen, UA: 0.2

## 2010-11-10 LAB — HEPATIC FUNCTION PANEL
Albumin: 4.4 g/dL (ref 3.5–5.2)
Alkaline Phosphatase: 54 U/L (ref 39–117)
Total Bilirubin: 0.8 mg/dL (ref 0.3–1.2)
Total Protein: 7 g/dL (ref 6.0–8.3)

## 2010-11-10 LAB — BASIC METABOLIC PANEL
BUN: 21 mg/dL (ref 6–23)
CO2: 28 mEq/L (ref 19–32)
Chloride: 101 mEq/L (ref 96–112)
Creatinine, Ser: 1.3 mg/dL (ref 0.4–1.5)
GFR: 56.42 mL/min — ABNORMAL LOW (ref >60.00)
Glucose, Bld: 101 mg/dL — ABNORMAL HIGH (ref 70–99)
Potassium: 4.2 mEq/L (ref 3.5–5.1)
Sodium: 137 mEq/L (ref 135–145)

## 2010-11-10 LAB — LIPID PANEL
Cholesterol: 79 mg/dL (ref 0–200)
LDL Cholesterol: 40 mg/dL (ref 0–99)

## 2010-11-10 MED ORDER — VARENICLINE TARTRATE 1 MG PO TABS: 1.0000 mg | ORAL_TABLET | Freq: Two times a day (BID) | ORAL | Status: AC

## 2010-11-10 MED ORDER — LOSARTAN POTASSIUM 50 MG PO TABS: 50.0000 mg | ORAL_TABLET | Freq: Every day | ORAL | Status: DC

## 2010-11-10 MED ORDER — PREDNISONE 20 MG PO TABS: ORAL_TABLET | ORAL | Status: DC

## 2010-11-10 MED ORDER — PREDNISONE 20 MG PO TABS: 20.0000 mg | ORAL_TABLET | Freq: Every day | ORAL | Status: AC

## 2010-11-10 MED ORDER — AMLODIPINE BESYLATE 10 MG PO TABS: 10.0000 mg | ORAL_TABLET | Freq: Every day | ORAL | Status: DC

## 2010-11-10 MED ORDER — LABETALOL HCL 200 MG PO TABS: ORAL_TABLET | ORAL | Status: DC

## 2010-11-10 MED ORDER — HYDROCHLOROTHIAZIDE 25 MG PO TABS: 25.0000 mg | ORAL_TABLET | Freq: Every day | ORAL | Status: DC

## 2010-11-10 NOTE — Progress Notes (Signed)
Subjective:    Patient ID: Mitchell Neal, male    DOB: 04-17-1940, 70 y.o.   MRN: [id number]  HPI Mitchell Neal is a 70 year old single male, smoker, who comes in today for Medicare wellness examination because of underlying hypertension and tobacco abuse.  Erectile dysfunction.  He takes Norvasc 10 mg daily, hydrochlorothiazide, 25 mg daily, labetalol 400 mg b.i.d., will start an 50 mg daily for hypertension.  BP is running 110/70 since he is lightheaded when he stands up.  Will begin to back off on his medication.  We gave him some Viagra for erectile dysfunction.  He does not have a partner therefore, he never got the prescription filled.  He quit smoking cold [place] for 6 months and now has restarted smoking again a pack of cigarettes a day.  We outlined the chantix program and insists that he stop smoking.  He does not get routine eye care, he says his hearing is bad, but he does not wish to pursue it, no regular dental care, colonoscopy, normal, activities of daily living, Activity-wise, he walks 2 miles per day and has no chest pain or shortness of breath.  He does not walk on a regular basis.  Cognitive function, normal.  Although safety reviewed.  No issues identified.  No guns in the house.  He does have a healthcare power of attorney and living will.  Tetanus 2011 will give Pneumovax today.  Information given on shingles,    Review of Systems  Constitutional: Negative.   HENT: Negative.   Eyes: Negative.   Respiratory: Negative.   Cardiovascular: Negative.   Gastrointestinal: Negative.   Genitourinary: Negative.   Musculoskeletal: Negative.   Skin: Negative.   Neurological: Negative.   Hematological: Negative.   Psychiatric/Behavioral: Negative.        Objective:   Physical Exam  Constitutional: He is oriented to person, place, and time. He appears well-developed and well-nourished.  HENT:  Head: Normocephalic and atraumatic.  Right Ear: External ear normal.  Left Ear:  External ear normal.  Nose: Nose normal.  Mouth/Throat: Oropharynx is clear and moist.  Eyes: Conjunctivae and EOM are normal. Pupils are equal, round, and reactive to light.  Neck: Normal range of motion. Neck supple. No JVD present. No tracheal deviation present. No thyromegaly present.  Cardiovascular: Normal rate, regular rhythm, normal heart sounds and intact distal pulses.  Exam reveals no gallop and no friction rub.   No murmur heard. Pulmonary/Chest: No stridor. No respiratory distress. He has wheezes. He has no rales. He exhibits no tenderness.       Barely audible breath sounds bilaterally, inspiratory and expiratory wheezing  Abdominal: Soft. Bowel sounds are normal. He exhibits no distension and no mass. There is no tenderness. There is no rebound and no guarding.  Genitourinary: Rectum normal, prostate normal and penis normal. Guaiac negative stool. No penile tenderness.  Musculoskeletal: Normal range of motion. He exhibits no edema and no tenderness.  Lymphadenopathy:    He has no cervical adenopathy.  Neurological: He is alert and oriented to person, place, and time. He has normal reflexes. No cranial nerve deficit. He exhibits normal muscle tone.  Skin: Skin is warm and dry. No rash noted. No erythema. No pallor.  Psychiatric: He has a normal mood and affect. His behavior is normal. Judgment and thought content normal.          Assessment & Plan:  Hypertension with BP 20 decrease the labetalol to 200 mg b.i.d. BP check.  Q.a.m. Follow-up  in 4 weeks.  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  Plan insisted he stop smoking now.  Chest x-ray, prednisone burst and taper also chantix one half tablet daily did help ameliorate craving for nicotine.  Recommend he get routine eye care, hearing, evaluation, regular dental care, and the shingles.  Vaccination

## 2010-11-10 NOTE — Patient Instructions (Signed)
Stop smokiing now.  Begin chantix today one half tablet daily.  Begin prednisone and take as directed two tabs x 3 days and then taper as directed.  Decrease the labetalol to one tablet twice daily.  Check your blood pressure daily in the morning.  Return in 4 weeks for follow-up with all your blood pressure readings.  After your lab work go to the main office now for a chest x-ray

## 2010-11-25 IMAGING — CT CT ABD-PELV W/O CM
2 of 4 series · 17 of 46 positions shown, 19 images · non-contrast
Comparison: None.

CLINICAL DATA: Left lower quadrant and flank pain.  Microhematuria.

CT ABDOMEN AND PELVIS WITHOUT CONTRAST
TECHNIQUE: Multidetector CT imaging of the abdomen and pelvis was
performed following the standard protocol without intravenous
contrast.

[Series 2: under 200# stone no prev · axial · 0.74mm/px · z∈[+1274,+1684]mm · 14 of 90 slices shown, 16 images]
[im 4/90  soft-tissue]
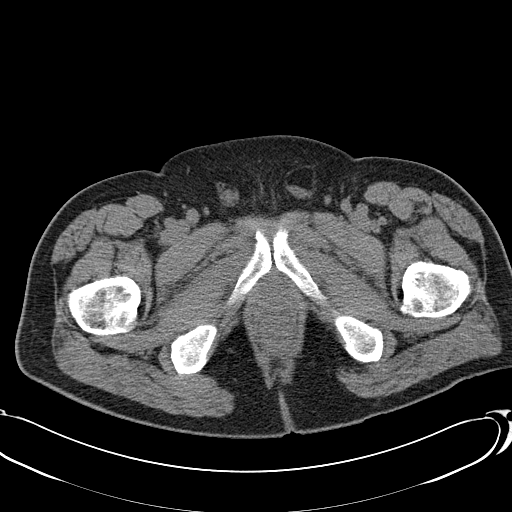
[im 4/90  bone]
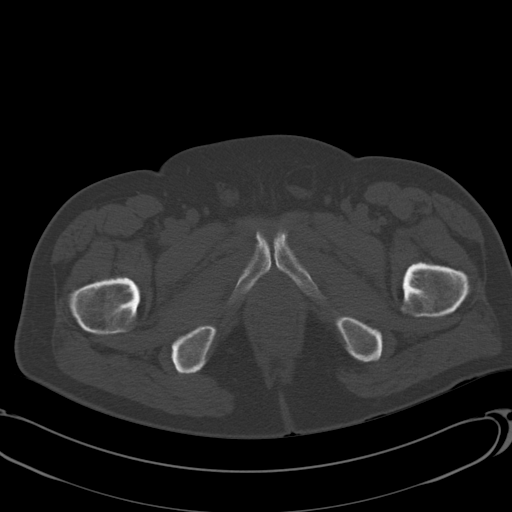
[im 11/90  soft-tissue]
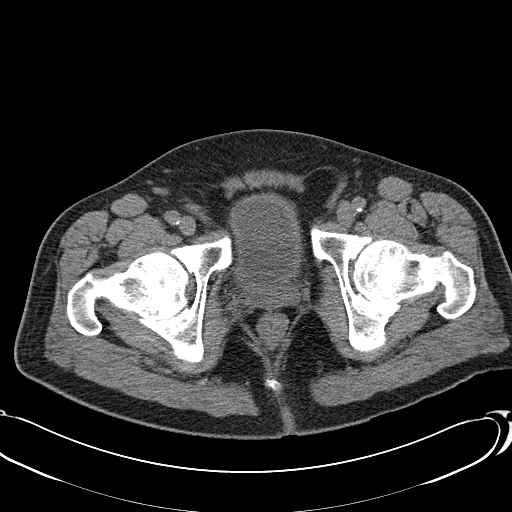
[im 18/90  soft-tissue]
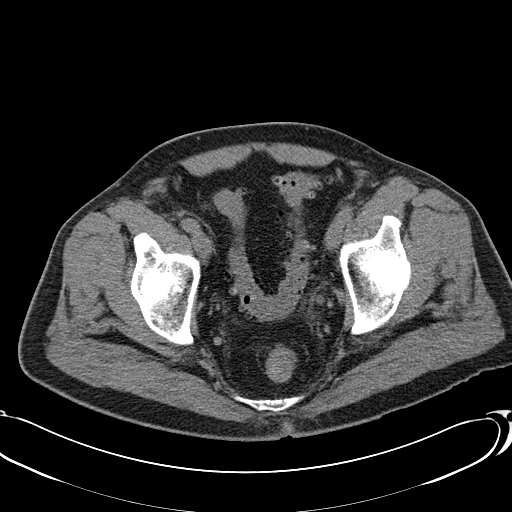
[im 24/90  soft-tissue]
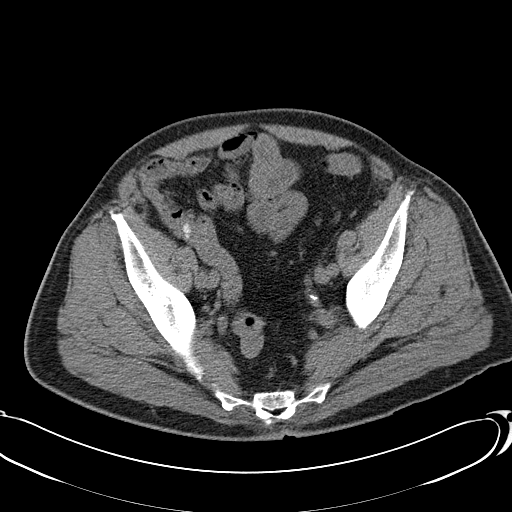
[im 31/90  soft-tissue]
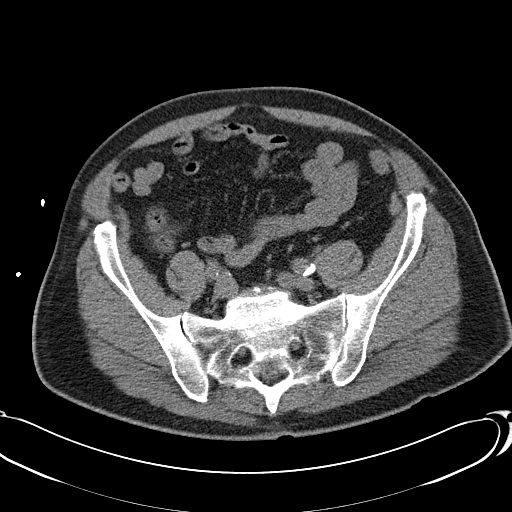
[im 35/90  soft-tissue]
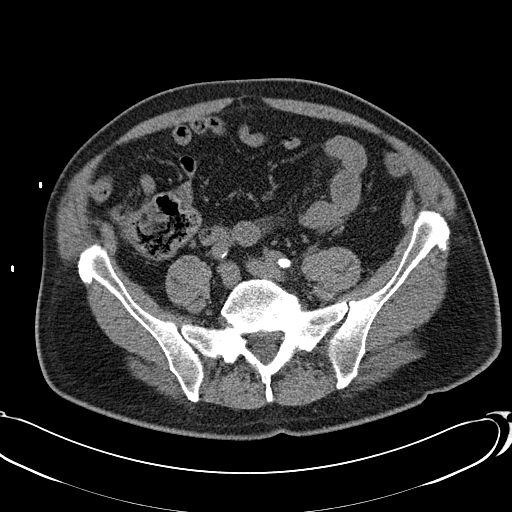
[im 42/90  soft-tissue]
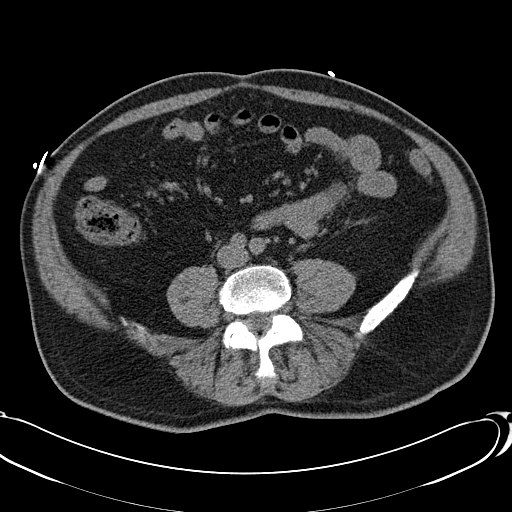
[im 48/90  soft-tissue]
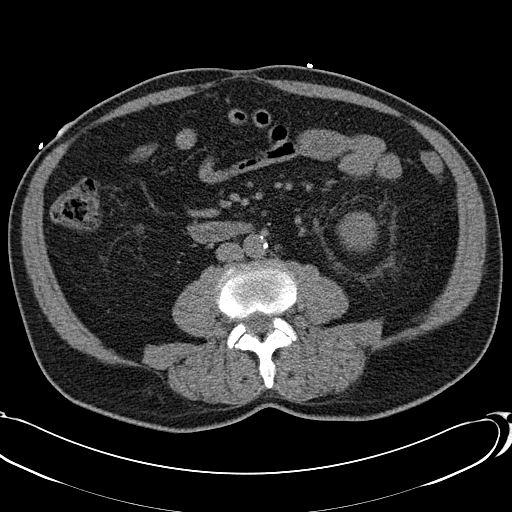
[im 55/90  soft-tissue]
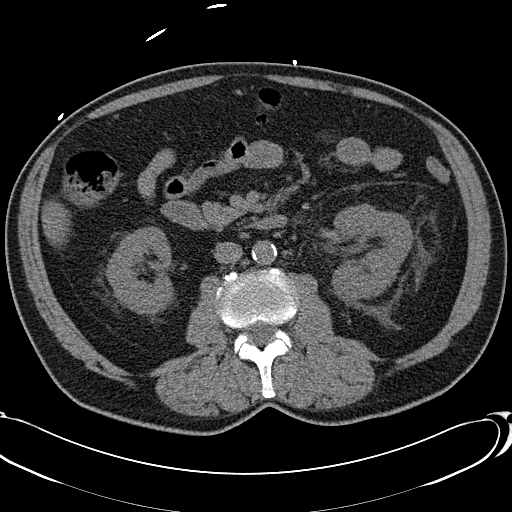
[im 55/90  bone]
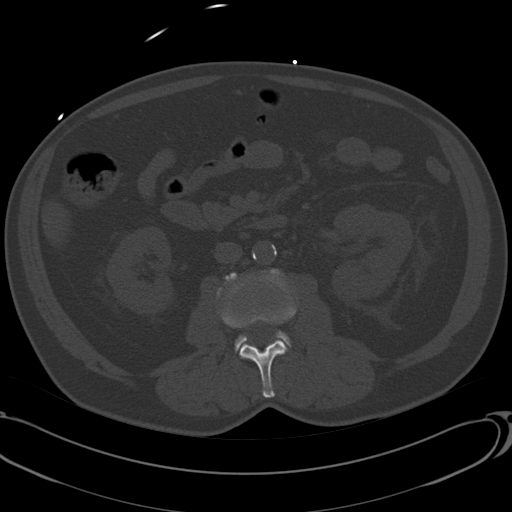
[im 59/90  soft-tissue]
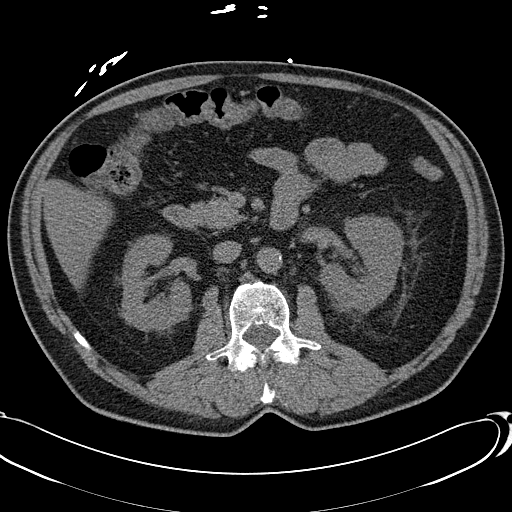
[im 66/90  soft-tissue]
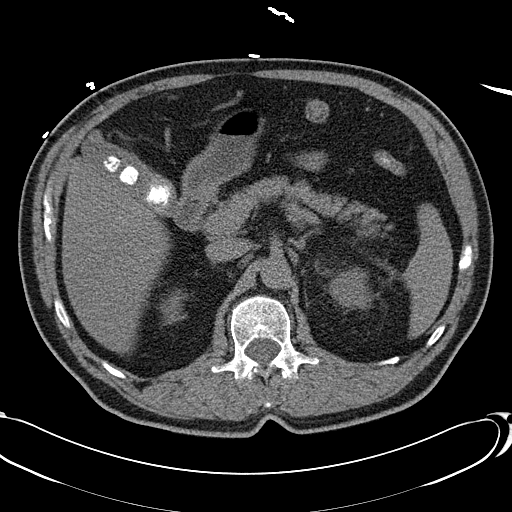
[im 72/90  soft-tissue]
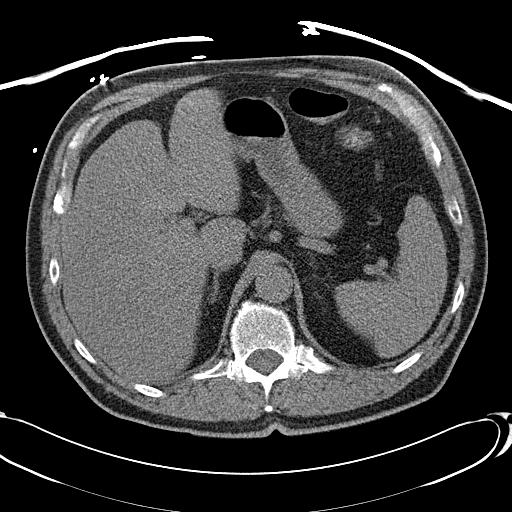
[im 79/90  soft-tissue]
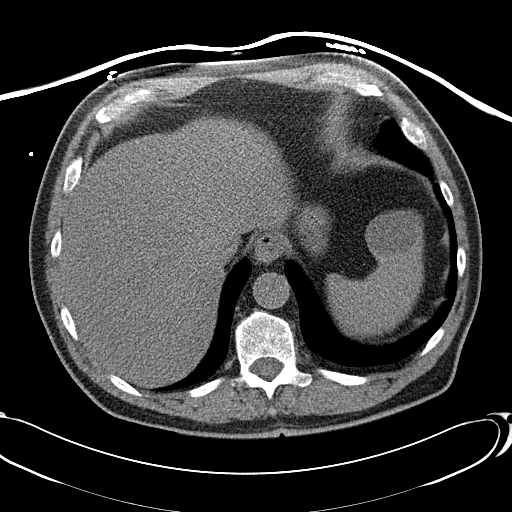
[im 86/90  soft-tissue]
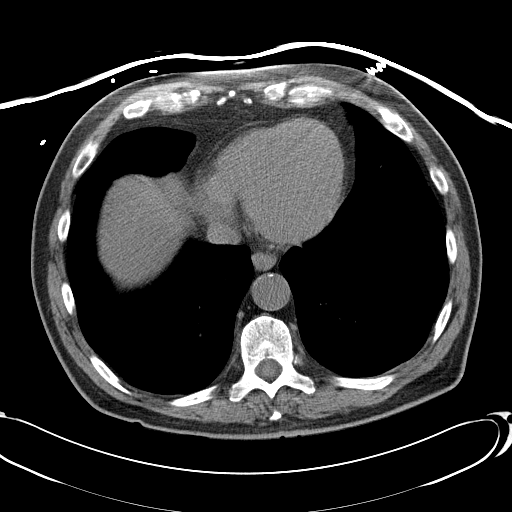

[Series 602: <mpr thick range> · coronal · 0.88mm/px · 3 of 82 slices shown]
[im 28/82  soft-tissue]
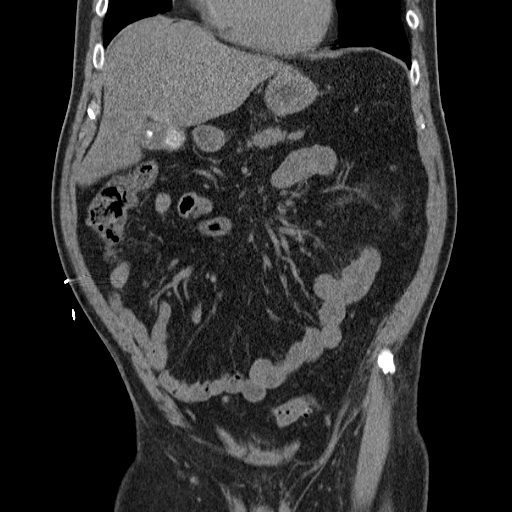
[im 37/82  soft-tissue]
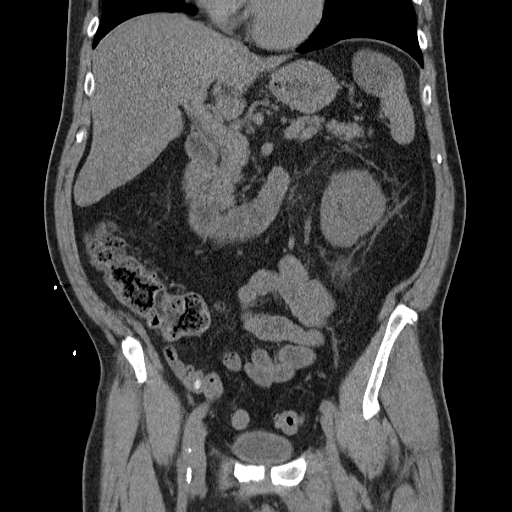
[im 46/82  soft-tissue]
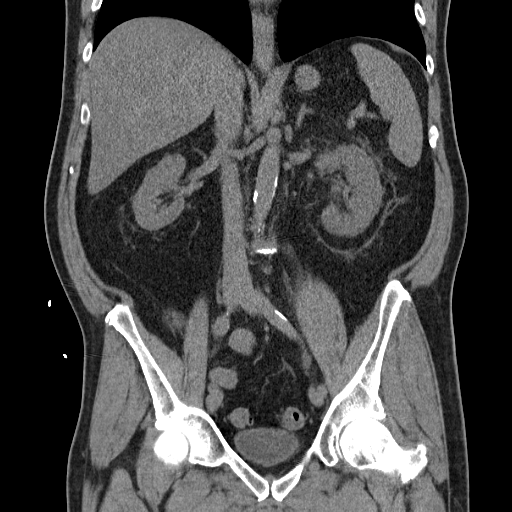

[17 of 46 positions shown; findings below may reference images not displayed]

FINDINGS: There is diffuse fatty infiltration of the liver which
measures 18.5 cm and cranial caudal length.  4.1 cm cystic lesion
is seen in the anterior spleen.  The stomach, duodenum, pancreas,
and adrenal glands are unremarkable.  Numerous large calcified
gallstones, measuring up to 2.2 cm in diameter, are visualized in
the gallbladder.

3 mm nonobstructing stone is seen in the interpolar right kidney.
No stones are seen in the left kidney although the left kidney is
edematous and has perinephric edema.  There is mild fullness of the
left intrarenal collecting system and ureter.  The left ureter
remains mildly distended to the level of the ureteral vesicle
junction where a punctate (1-2 mm) stone is identified.  No right
ureteral stones are evident.

No abdominal aortic aneurysm.  No evidence for abdominal
lymphadenopathy.

No pelvic sidewall lymphadenopathy.  Bladder and is unremarkable
aside from the left UVJ stone described above.  Diverticular
disease is seen in the sigmoid and left colon, without
diverticulitis.  The terminal ileum is normal.  The appendix is not
visualized.  Left inguinal hernia contains only fat.

Bone windows reveal no worrisome lytic or sclerotic osseous
lesions.
IMPRESSION: 1-2 mm stone at the left UVJ causes mild secondary changes in the
left kidney.

Fatty enlarged liver.

4 cm cystic lesion in the anterior spleen is probably a splenic
cyst or pseudocyst.  MRI could be used to further evaluate as
clinically warranted.

Colonic diverticulosis.

Cholelithiasis.

## 2010-12-08 ENCOUNTER — Ambulatory Visit (INDEPENDENT_AMBULATORY_CARE_PROVIDER_SITE_OTHER): Payer: Medicare Other | Admitting: Family Medicine

## 2010-12-08 ENCOUNTER — Encounter: Payer: Self-pay | Admitting: Family Medicine

## 2010-12-08 VITALS — BP 152/90 | Temp 98.2°F | Wt 194.0 lb

## 2010-12-08 DIAGNOSIS — I10 Essential (primary) hypertension: Secondary | ICD-10-CM

## 2010-12-08 DIAGNOSIS — F17200 Nicotine dependence, unspecified, uncomplicated: Secondary | ICD-10-CM

## 2010-12-08 DIAGNOSIS — J449 Chronic obstructive pulmonary disease, unspecified: Secondary | ICD-10-CM

## 2010-12-08 DIAGNOSIS — Z23 Encounter for immunization: Secondary | ICD-10-CM

## 2010-12-08 DIAGNOSIS — Z720 Tobacco use: Secondary | ICD-10-CM

## 2010-12-08 NOTE — Progress Notes (Signed)
  Subjective:    Patient ID: Mitchell Neal, male    DOB: June 27, 1940, 70 y.o.   MRN: [id number]  HPIjoe  is a 70 year old male, who comes in today for evaluation of 3 problems.  We saw him mid-August at that time.  He was smoking a pack plus a day.  He was coughing and wheezing.  We started him on the chantix program one half tab b.i.d. And he stop smoking immediately.  He also took prednisone for 3 weeks and is now off the prednisone.  He states his lung function has improved.  He is not coughing.  He is not wheezing.  He feels under percent better.  He, states he's committed never to smoke again.  His blood pressure today is 180/80.  His BPs at home and been in the 130/80 range.  He states yesterday he ate a lot of sodium and last night didn't sleep well.  He had his beta blocker, down to 100 mg daily.      Review of Systems    General metabolic pulmonary review of systems otherwise negative Objective:   Physical Exam  Well-developed well-nourished, male in no acute distress.  Examination of the lung shows late expiratory mild wheezing.  BP right arm sitting position 180/80     Assessment & Plan:  Tobacco abuse, off nicotine, now for one month.  Asthma, COPD plan continue to abstain from nicotine continue chantix one half tab daily x 6 months and taper.  Hypertension.  Increase the beta-blocker to half a tablet b.i.d. Until blood pressure dropped back to normal then go back to half a tablet a day.  Follow-up in 3 months

## 2010-12-08 NOTE — Patient Instructions (Signed)
Continued to not smoke!!!!!!!!!!!!!!!!.  Continue the chantix one half tablet twice daily.  Increase the labetalol to a half a tablet twice a day and two.  Blood pressure dropped back to normal and then go back to half a tablet a day.  Return in 3 months, sooner if any problems

## 2011-03-09 ENCOUNTER — Ambulatory Visit (INDEPENDENT_AMBULATORY_CARE_PROVIDER_SITE_OTHER): Payer: Medicare Other | Admitting: Family Medicine

## 2011-03-09 ENCOUNTER — Encounter: Payer: Self-pay | Admitting: Family Medicine

## 2011-03-09 DIAGNOSIS — R351 Nocturia: Secondary | ICD-10-CM

## 2011-03-09 DIAGNOSIS — N401 Enlarged prostate with lower urinary tract symptoms: Secondary | ICD-10-CM | POA: Insufficient documentation

## 2011-03-09 DIAGNOSIS — J4489 Other specified chronic obstructive pulmonary disease: Secondary | ICD-10-CM

## 2011-03-09 DIAGNOSIS — I10 Essential (primary) hypertension: Secondary | ICD-10-CM

## 2011-03-09 DIAGNOSIS — J449 Chronic obstructive pulmonary disease, unspecified: Secondary | ICD-10-CM

## 2011-03-09 DIAGNOSIS — N138 Other obstructive and reflux uropathy: Secondary | ICD-10-CM

## 2011-03-09 NOTE — Patient Instructions (Signed)
Check your blood pressure weekly.  Return the second week in August for your annual exam.  Labs one week prior.  Congratulations on no more smoking!!!!!!!!!!!!!!!!!!!!!!!!

## 2011-03-09 NOTE — Progress Notes (Signed)
  Subjective:    Patient ID: Mitchell Neal, male    DOB: September 22, 1940, 70 y.o.   MRN: [id number]  HPI Gabriel Rung is a 70 year old male, who comes in today for follow-up of COPD, tobacco abuse, and hypertension.  His blood pressure is stable 140/80 on Norvasc 10 mg daily, labetalol 100 mg b.i.d., losartan 50 mg daily.  We put him on a smoking cessation program with chantix and he stop smoking completely.  It is not had a cigarette now in 4 months.  He was  congratulated   Review of Systems    General and cardiovascular review of systems otherwise negative Objective:   Physical Exam Well-developed well-nourished, male in no acute distress.  BP 140/80 right arm sitting position      Assessment & Plan:  Hypertension and goal continue current therapy.  Monitor blood pressure weekly at home.  Ex- smoker x 4 months

## 2011-04-19 ENCOUNTER — Encounter: Payer: Self-pay | Admitting: Family Medicine

## 2011-04-19 ENCOUNTER — Ambulatory Visit (INDEPENDENT_AMBULATORY_CARE_PROVIDER_SITE_OTHER): Payer: Medicare Other | Admitting: Family Medicine

## 2011-04-19 VITALS — BP 178/90 | Temp 98.4°F | Wt 208.0 lb

## 2011-04-19 DIAGNOSIS — J449 Chronic obstructive pulmonary disease, unspecified: Secondary | ICD-10-CM

## 2011-04-19 MED ORDER — HYDROCODONE-HOMATROPINE 5-1.5 MG/5ML PO SYRP: ORAL_SOLUTION | ORAL | Status: DC

## 2011-04-19 MED ORDER — PREDNISONE 20 MG PO TABS: ORAL_TABLET | ORAL | Status: DC

## 2011-04-19 MED ORDER — ALBUTEROL SULFATE HFA 108 (90 BASE) MCG/ACT IN AERS: INHALATION_SPRAY | RESPIRATORY_TRACT | Status: DC

## 2011-04-19 NOTE — Patient Instructions (Signed)
Take the prednisone as directed,,,,,,,,, 3 tablets now then two tabs x 3 days or Until you feel a  Whole lot better and then begin to taper as outlined  One a vaporizer or humidifier in her bedroom at night.  Hydromet 0.5-teaspoon 3 times daily as needed.  Albuterol one puff 3 times daily.  Return next Tuesday for follow-up, sooner if any problems

## 2011-04-19 NOTE — Progress Notes (Signed)
  Subjective:    Patient ID: Mitchell Neal, male    DOB: 04/08/1940, 71 y.o.   MRN: [id number]  HPI Mitchell Neal is a 71 year old male ex smoker x 2 months........ Who comes in today for evaluation of asthma.  He states about two weeks ago.  His asthma flared up.  Initially, he would come and go and last couple days has been fairly constant and he is waking up at night wheezing.  No fever no sputum production   Review of Systems    General and pulmonary review of systems otherwise negative Objective:   Physical Exam Thin male, in no acute distress.  HEENT negative.  Neck was supple.  No adenopathy.  Expiratory rate 12 and unlabored.  Pulmonary exam shows decreased breath sounds, but symmetrical and expiratory mild to moderate wheezing       Assessment & Plan:  COPD with asthma.  Plan prednisone burst and taper, albuterol one puff 3 times daily Hydromet 0.5-teaspoon 3 times daily.  Follow-up in one week if

## 2011-04-24 ENCOUNTER — Encounter: Payer: Self-pay | Admitting: Family Medicine

## 2011-04-24 ENCOUNTER — Ambulatory Visit (INDEPENDENT_AMBULATORY_CARE_PROVIDER_SITE_OTHER): Payer: Medicare Other | Admitting: Family Medicine

## 2011-04-24 DIAGNOSIS — J449 Chronic obstructive pulmonary disease, unspecified: Secondary | ICD-10-CM

## 2011-04-24 DIAGNOSIS — J4489 Other specified chronic obstructive pulmonary disease: Secondary | ICD-10-CM

## 2011-04-24 DIAGNOSIS — I10 Essential (primary) hypertension: Secondary | ICD-10-CM

## 2011-04-24 MED ORDER — BECLOMETHASONE DIPROPIONATE 40 MCG/ACT IN AERS: INHALATION_SPRAY | RESPIRATORY_TRACT | Status: DC

## 2011-04-24 NOTE — Patient Instructions (Signed)
Stop the oral steroids.  Begin Qvar,,,,,,,,,,,,, one puff twice daily.  The medicine that you take in the morning for your blood pressure should be 10 mg Norvasc tablet, 50 mg of losartin , and a full labetalol tablet,,,,,,,,,,,,, at bedtime, take one labetalol tablet.  Check y  blood pressure daily in the morning and return in two weeks for follow-up

## 2011-04-24 NOTE — Progress Notes (Signed)
  Subjective:    Patient ID: Mitchell Neal, male    DOB: 02-15-1941, 71 y.o.   MRN: [id number]  HPI Merle is a 71 year old, married male, ex-smoker, who comes in today accompanied by his daughter for evaluation of COPD and hypertension.  We saw him last week with a flare of his COPD and start him on a short course of prednisone 60 mg stat with a taper.  However, he experienced severe muscle weakness, associated with the steroids.  He feels like his wheezing has diminished, but not gone.  He also has a history of hypertension.  BP today 180/80 right arm sitting position.  He, states she's been compliant with his medication.   Review of Systems Pulmonary and cardiovascular review of systems otherwise negative    Objective:   Physical Exam  Well-developed well-nourished, male in no acute distress with inspiratory rate 12 and unlabored.  Pulmonary exam shows decreased breath sounds consistent with underlying COPD, married minimal late expiratory wheezing, symmetrical, bilateral.  BP right arm sitting position, 180/80.      Assessment & Plan:  Hypertension not at goal.  Plan increase labetalol, BP check.  Daily follow-up in one week.  COPD, DC the oral steroids, go with the inhaled steroids Qvar 40 ,,,,,,, one p b.i.d.

## 2011-04-25 ENCOUNTER — Ambulatory Visit: Payer: Medicare Other | Admitting: Family Medicine

## 2011-04-27 ENCOUNTER — Encounter: Payer: Self-pay | Admitting: Family Medicine

## 2011-04-27 ENCOUNTER — Telehealth: Payer: Self-pay | Admitting: Family Medicine

## 2011-04-27 ENCOUNTER — Ambulatory Visit (INDEPENDENT_AMBULATORY_CARE_PROVIDER_SITE_OTHER): Payer: Medicare Other | Admitting: Family Medicine

## 2011-04-27 DIAGNOSIS — I10 Essential (primary) hypertension: Secondary | ICD-10-CM

## 2011-04-27 NOTE — Patient Instructions (Signed)
Taking another 50 mg Cozaar dose this afternoon then starting tomorrow in the morning take the Norvasc, 200 mg labetalol tablet, and 50 mg of Cozaar ,,,,,,,,,,,,at  bedtime take a 50 mg Cozaar in the 200 mg labetalol tablet  BP check every morning followup as outlined

## 2011-04-27 NOTE — Telephone Encounter (Signed)
[facility] Triage Call Report Triage Record Num: [id number] Operator: Alphonsa Overall Patient Name: Mitchell Neal Call Date & Time: 04/26/2011 8:50:12PM Patient Phone: [phone] PCP: Tinnie Gens A. Todd Patient Gender: Male PCP Fax : [phone] Patient DOB: 01-Apr-1940 Practice Name: [facility] Reason for Call: Caller: De/Patient; PCP: Roderick Pee.; CB#: [phone]; Call regarding BP 188/90, legs feel wobbly Pt calling about OV 04/25/11. Switched from Prednisone to Qvar. Calling 04/26/11 for weakness in legs. Pt on Labetalol BID. 190/90 BP at 2015. 151/75 at 2045. Blurred vision noted 04/26/11 when BP higher. Pt with fading symptoms of weakness at 2045. Elevation noted with change of medication. Home care advice given. Pt aware needs to f/u with MD office in am or sooner for worsening s/s. Protocol(s) Used: Hypertension, Diagnosed or Suspected Recommended Outcome per Protocol: See Provider within 24 hours Reason for Outcome: A sudden elevation in blood pressure AND has not been taking blood pressure medication as prescribed, or recently started, changed, or stopped taking prescription medication; or recently started taking nonprescription or alternative medicine Care Advice: Call EMS 911 if new symptoms develop, such as severe shortness of breath, chest pain, change in mental status, acute neurologic deficit, seizure, visual disturbances, pulse rate > 120 / minute, or very irregular pulse. ~ ~ When speaking with provider, discuss symptoms or reasons for changing or not following treatment plan. ~ Call provider if systolic BP is 180 or greater, or if diastolic BP is 120 or greater. ~ SYMPTOM / CONDITION MANAGEMENT ~ List, or take, all current prescription(s), nonprescription or alternative medication(s) to provider for evaluation. LIFESTYLE MODIFICATION FOR HYPERTENSION: - Weight reduction will help lower blood pressure in individuals who are 10% or more above their ideal  body weight. - Eat foods low in saturated fat and sugar, and high in complex carbohydrates (vegetables, fruits, whole grains). - Drink alcohol only in moderate amounts (12 oz beer, 5 oz wine, or 1 1/2 oz of distilled alcohol such as vodka, gin, etc.) and not more than 2 drinks/day for men or 1 drink/day for women or lighter-weight persons. - Get at least 30 minutes of moderate aerobic exercise (using as much energy as walking 2 miles in 30 minutes) most days of the week, preferably daily. - Stop smoking to decrease risk for cardiovascular and pulmonary disease, as well as cancer. - Keep regularly scheduled appointments with your provider. ~ Medication Advice: - Discontinue all nonprescription and alternative medications, especially stimulants, until evaluated by provider. - Take prescribed medications as directed, following label instructions for the medication. - Do not change medications or dosing regimen until provider is consulted. - Know possible side effects of medication and what to do if they occur. - Tell provider all prescription, nonprescription or alternative medications that you take ~

## 2011-04-27 NOTE — Progress Notes (Signed)
  Subjective:    Patient ID: Mitchell Neal, male    DOB: 1940/05/31, 71 y.o.   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  We increased his labetalol to 200 mg twice a day a couple days ago because his blood pressure was elevated. Yesterday his blood pressure in the afternoon jumped up to 180/90. It subsequently came down to 130/65 then this morning it was 173/88 dropped down to 135/71 this afternoon is 180/90   Review of Systems General and cardiovascular review of systems otherwise negative    Objective:   Physical Exam Well-developed well-nourished man in acute distress BP right arm sitting position 180/90 pulse 80 and regular       Assessment & Plan:  Hypertension not at goal continue Norvasc 10 mg daily increase Cozaar to 50 mg twice a day continue labetalol 200 mg twice a day BP check every morning followup in 2 weeks

## 2011-05-10 ENCOUNTER — Ambulatory Visit (INDEPENDENT_AMBULATORY_CARE_PROVIDER_SITE_OTHER): Payer: Medicare Other | Admitting: Family Medicine

## 2011-05-10 ENCOUNTER — Encounter: Payer: Self-pay | Admitting: Family Medicine

## 2011-05-10 DIAGNOSIS — I10 Essential (primary) hypertension: Secondary | ICD-10-CM

## 2011-05-10 NOTE — Patient Instructions (Signed)
Increase the labetalol take 2 tablets daily at bedtime  Continue your other medications  Check your blood pressure daily in the morning  Return in 2 weeks for followup

## 2011-05-10 NOTE — Progress Notes (Signed)
  Subjective:    Patient ID: Mitchell Neal, male    DOB: [birth year], 71 y.o.   MRN: [id number]  HPI Mitchell Neal is a 71-year-old male who comes in today for evaluation of hypertension  He is on Norvasc 10 mg daily, Cozaar 50 mg twice a day and labetalol 200 mg twice a day pulse is 70 and regular BP 180/80 right arm sitting position    Review of Systems    general and cardiovascular review of systems otherwise negative he's taking the inhaled steroid Qvar one puff twice daily Objective:   Physical Exam BP right arm sitting position 180/80 pulse 70 and regular       Assessment & Plan:  Hypertension not at goal plan increase the labetalol take 400 mg at bedtime continue the 200 mg dose in the morning followup in 2 weeks

## 2011-05-24 ENCOUNTER — Ambulatory Visit (INDEPENDENT_AMBULATORY_CARE_PROVIDER_SITE_OTHER): Payer: Medicare Other | Admitting: Family Medicine

## 2011-05-24 ENCOUNTER — Encounter: Payer: Self-pay | Admitting: Family Medicine

## 2011-05-24 VITALS — BP 150/78 | Temp 98.2°F | Wt 206.0 lb

## 2011-05-24 DIAGNOSIS — I10 Essential (primary) hypertension: Secondary | ICD-10-CM

## 2011-05-24 MED ORDER — LABETALOL HCL 200 MG PO TABS: ORAL_TABLET | ORAL | Status: DC

## 2011-05-24 NOTE — Progress Notes (Signed)
  Subjective:    Patient ID: Mitchell Neal, male    DOB: 1940-05-08, 71 y.o.   MRN: [id number]  HPI Mitchell Neal is a 71 year old male who comes in today for followup of hypertension  He's currently on Norvasc 10 mg daily, losartan 50 mg twice a day, labetalol 200 mg in the morning and 400 mg in the evening.  Blood pressure right arm sitting position 150/80 his digital machine is reading 164/80. Review of Systems    general and cardiovascular review of systems otherwise negative pulse is 70 and regular Objective:   Physical Exam  Well-developed well-nourished male in no acute distress BP as above      Assessment & Plan:  Hypertension approaching goal plan increase the labetalol to 400 mg twice a day followup in 6 weeks

## 2011-05-24 NOTE — Patient Instructions (Signed)
Continue the Norvasc and the low [place] as your currently doing  Increase the labetalol to 2 tablets twice daily  Followup in 6 weeks

## 2011-05-26 ENCOUNTER — Other Ambulatory Visit: Payer: Self-pay | Admitting: *Deleted

## 2011-05-26 MED ORDER — LOSARTAN POTASSIUM 50 MG PO TABS: 50.0000 mg | ORAL_TABLET | Freq: Two times a day (BID) | ORAL | Status: DC

## 2011-06-12 ENCOUNTER — Telehealth: Payer: Self-pay

## 2011-06-12 ENCOUNTER — Ambulatory Visit (INDEPENDENT_AMBULATORY_CARE_PROVIDER_SITE_OTHER): Payer: Medicare Other | Admitting: Internal Medicine

## 2011-06-12 ENCOUNTER — Encounter: Payer: Self-pay | Admitting: Internal Medicine

## 2011-06-12 VITALS — BP 160/80 | HR 66 | Temp 98.0°F | Wt 210.0 lb

## 2011-06-12 DIAGNOSIS — J449 Chronic obstructive pulmonary disease, unspecified: Secondary | ICD-10-CM

## 2011-06-12 DIAGNOSIS — I10 Essential (primary) hypertension: Secondary | ICD-10-CM

## 2011-06-12 DIAGNOSIS — J4489 Other specified chronic obstructive pulmonary disease: Secondary | ICD-10-CM

## 2011-06-12 MED ORDER — ALBUTEROL SULFATE HFA 108 (90 BASE) MCG/ACT IN AERS: INHALATION_SPRAY | RESPIRATORY_TRACT | Status: DC

## 2011-06-12 MED ORDER — LABETALOL HCL 300 MG PO TABS: 300.0000 mg | ORAL_TABLET | Freq: Two times a day (BID) | ORAL | Status: DC

## 2011-06-12 MED ORDER — FLUTICASONE PROPIONATE 50 MCG/ACT NA SUSP: 2.0000 | Freq: Every day | NASAL | Status: AC

## 2011-06-12 NOTE — Progress Notes (Signed)
  Subjective:    Patient ID: Mitchell Neal, male    DOB: Jul 02, 1940, 71 y.o.   MRN: [id number]  HPI  71 year old patient who has treated hypertension as well as COPD with asthma. He has had a URI recently and presents with a chief complaint of chest congestion wheezing and cough. He has been using Qvar but apparently has not had albuterol on hand. Cough is nonproductive he feels generally unwell He has treated hypertension. At the time of last visit labetalol was up titrated to 400 mg twice daily he states that he has had occasional quite low readings associated with fatigue and weakness. Blood pressure here today on arrival was 160/80. There is good correlation with his home blood pressure cuff    Review of Systems  Constitutional: Negative for fever, chills, appetite change and fatigue.  HENT: Positive for congestion and rhinorrhea. Negative for hearing loss, ear pain, sore throat, trouble swallowing, neck stiffness, dental problem, voice change and tinnitus.   Eyes: Negative for pain, discharge and visual disturbance.  Respiratory: Positive for cough, shortness of breath and wheezing. Negative for chest tightness and stridor.   Cardiovascular: Negative for chest pain, palpitations and leg swelling.  Gastrointestinal: Negative for nausea, vomiting, abdominal pain, diarrhea, constipation, blood in stool and abdominal distention.  Genitourinary: Negative for urgency, hematuria, flank pain, discharge, difficulty urinating and genital sores.  Musculoskeletal: Negative for myalgias, back pain, joint swelling, arthralgias and gait problem.  Skin: Negative for rash.  Neurological: Negative for dizziness, syncope, speech difficulty, weakness, numbness and headaches.  Hematological: Negative for adenopathy. Does not bruise/bleed easily.  Psychiatric/Behavioral: Negative for behavioral problems and dysphoric mood. The patient is not nervous/anxious.        Objective:   Physical Exam    Constitutional: He is oriented to person, place, and time. He appears well-developed.  HENT:  Head: Normocephalic.  Right Ear: External ear normal.  Left Ear: External ear normal.  Eyes: Conjunctivae and EOM are normal.  Neck: Normal range of motion.  Cardiovascular: Normal rate and normal heart sounds.   Pulmonary/Chest: No respiratory distress. He has wheezes.       Faint scattered expiratory wheezing  Abdominal: Bowel sounds are normal.  Musculoskeletal: Normal range of motion. He exhibits no edema and no tenderness.  Neurological: He is alert and oriented to person, place, and time.  Psychiatric: He has a normal mood and affect. His behavior is normal.          Assessment & Plan:   Exacerbation of COPD secondary to URI. Will refill albuterol. He received nice benefit from a albuterol nebulizer treatment here in the office. We'll substitute Advair for Qvar for the next 2 weeks Hypertension patient states that he has had a number of low blood pressure readings at home associated with symptoms. He is very anxious about his blood pressure. Will down titrate  labetalol slightly to 300 mg twice daily. Followup with PCP in a couple of weeks as scheduled

## 2011-06-12 NOTE — Patient Instructions (Signed)
Get plenty of rest, Drink lots of  clear liquids, and use Tylenol or ibuprofen for fever and discomfort.    Substitute Advair for Qvar Use albuterol every 4-6 hours as needed for wheezing  Decrease labetalol to 300 mg twice daily  Office followup as scheduled

## 2011-06-12 NOTE — Telephone Encounter (Signed)
Pt called and states his bp medication was increased two weeks ago and last night he started having really bad congestion and it was causing him to have SOB when he lays down.  Pt denies any chest pains or numbness.  Pt advised to come in for an office visit and if SOB gets worse before his appt to go to the ER.

## 2011-06-20 ENCOUNTER — Telehealth: Payer: Self-pay

## 2011-06-20 MED ORDER — FLUTICASONE-SALMETEROL 250-50 MCG/DOSE IN AEPB: 1.0000 | INHALATION_SPRAY | Freq: Two times a day (BID) | RESPIRATORY_TRACT | Status: DC

## 2011-06-20 NOTE — Telephone Encounter (Signed)
ok 

## 2011-06-20 NOTE — Telephone Encounter (Signed)
Spoke with Barbara Cower from [facility] and he states that pt was given Advair samples on 06/12/11 and pt has ran out of the medication.  Pt would like to know if he needs to get an rx for Advair sent to his pharmacy.  Pls advise.

## 2011-06-20 NOTE — Telephone Encounter (Signed)
Confirmed with Dr. Amador Cunas Adviar 250/50.  Rx sent to pharmacy.

## 2011-06-23 ENCOUNTER — Encounter (HOSPITAL_COMMUNITY): Payer: Self-pay | Admitting: Adult Health

## 2011-06-23 ENCOUNTER — Observation Stay (HOSPITAL_COMMUNITY)
Admission: EM | Admit: 2011-06-23 | Discharge: 2011-06-24 | Disposition: A | Discharge status: Home / Self Care | Payer: Medicare Other | Attending: Family Medicine | Admitting: Family Medicine

## 2011-06-23 ENCOUNTER — Emergency Department (HOSPITAL_COMMUNITY): Payer: Medicare Other

## 2011-06-23 DIAGNOSIS — J440 Chronic obstructive pulmonary disease with acute lower respiratory infection: Secondary | ICD-10-CM | POA: Insufficient documentation

## 2011-06-23 DIAGNOSIS — Z720 Tobacco use: Secondary | ICD-10-CM

## 2011-06-23 DIAGNOSIS — N529 Male erectile dysfunction, unspecified: Secondary | ICD-10-CM

## 2011-06-23 DIAGNOSIS — N401 Enlarged prostate with lower urinary tract symptoms: Secondary | ICD-10-CM

## 2011-06-23 DIAGNOSIS — D72829 Elevated white blood cell count, unspecified: Secondary | ICD-10-CM | POA: Insufficient documentation

## 2011-06-23 DIAGNOSIS — I10 Essential (primary) hypertension: Secondary | ICD-10-CM | POA: Insufficient documentation

## 2011-06-23 DIAGNOSIS — R0902 Hypoxemia: Principal | ICD-10-CM | POA: Insufficient documentation

## 2011-06-23 DIAGNOSIS — J209 Acute bronchitis, unspecified: Secondary | ICD-10-CM | POA: Insufficient documentation

## 2011-06-23 DIAGNOSIS — J449 Chronic obstructive pulmonary disease, unspecified: Secondary | ICD-10-CM

## 2011-06-23 HISTORY — DX: Chronic obstructive pulmonary disease, unspecified: J44.9

## 2011-06-23 HISTORY — DX: Chronic kidney disease, unspecified: N18.9

## 2011-06-23 MED ORDER — IPRATROPIUM BROMIDE 0.02 % IN SOLN
RESPIRATORY_TRACT | Status: AC
  Administered 2011-06-23: 22:00:00
  Filled 2011-06-23: qty 2.5

## 2011-06-23 MED ORDER — ALBUTEROL SULFATE (5 MG/ML) 0.5% IN NEBU
5.0000 mg | INHALATION_SOLUTION | Freq: Once | RESPIRATORY_TRACT | Status: AC
  Administered 2011-06-23: 5 mg via RESPIRATORY_TRACT
  Filled 2011-06-23: qty 1

## 2011-06-23 NOTE — ED Provider Notes (Signed)
History     CSN: [id number]  Arrival date & time 06/23/11  2139   First MD Initiated Contact with Patient 06/23/11 2351      Chief Complaint  Patient presents with  . Hypertension  . Chills  . COPD    (Consider location/radiation/quality/duration/timing/severity/associated sxs/prior treatment) Patient is a 71 y.o. Neal presenting with hypertension. The history is provided by the patient.  Hypertension  He had onset at 6 PM of severe chills associated with some cough and dyspnea. He tried using his albuterol inhaler which did not give him any relief. He's had some slight rhinorrhea. Denies sore throat not, nausea, vomiting, diarrhea, arthralgias, myalgias. He checked his blood pressure and noted that it was severely elevated compared to what it usually is and it was 177/82. He has a history of COPD and had a severe reaction to prednisone the last time he took it. He denies any sick contacts. He was given an albuterol nebulizer treatment in the emergency department and is feeling considerably better. He is not aware of having a fever. Symptoms are moderate to severe. Nothing made his symptoms worse and only the albuterol nebulizer treatment in the emergency department management better.  Past Medical History  Diagnosis Date  . HYPERTENSION 10/07/2009  . 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 COPD (chronic obstructive pulmonary disease)     Past Surgical History  Procedure Date  . Tonsillectomy     History reviewed. No pertinent family history.  History  Substance Use Topics  . Smoking status: Former Smoker -- 0.5 packs/day    Types: Cigarettes  . Smokeless tobacco: Not on file  . Alcohol Use:       Review of Systems  All other systems reviewed and are negative.    Allergies  Prednisolone  Home Medications   Current Outpatient Rx  Name Route Sig Dispense Refill  . ALBUTEROL SULFATE HFA 108 (90 BASE) MCG/ACT IN AERS  One puffs 3 times daily 1 Inhaler 2  .  AMLODIPINE BESYLATE 10 MG PO TABS Oral Take 1 tablet (10 mg total) by mouth daily. 100 tablet 3  . VITAMIN C 100 MG PO TABS Oral Take 100 mg by mouth daily.      . ASPIRIN 81 MG PO TBEC Oral Take 81 mg by mouth daily.      Marland Kitchen FLUTICASONE PROPIONATE 50 MCG/ACT NA SUSP Nasal Place 2 sprays into the nose daily. 16 g 6  . FLUTICASONE-SALMETEROL 250-50 MCG/DOSE IN AEPB Inhalation Inhale 1 puff into the lungs 2 (two) times daily. 60 each 0  . LABETALOL HCL 300 MG PO TABS Oral Take 1 tablet (300 mg total) by mouth 2 (two) times daily. 60 tablet 3  . LOSARTAN POTASSIUM 50 MG PO TABS Oral Take 1 tablet (50 mg total) by mouth 2 (two) times daily. 60 tablet 3  . MENS MULTI VITAMIN & MINERAL PO Oral Take by mouth daily.        BP 186/92  Pulse 76  Temp(Src) 99.6 F (37.6 C) (Oral)  Resp 26  SpO2 96%  Physical Exam  Nursing note and vitals reviewed.  71 year old Neal who is resting comfortably and in no acute distress. Vital signs are significant for moderate tachypnea with respiratory rate of 26 and moderate hypertension with blood pressure 186/92. Oxygen saturation is 92% which is normal although borderline low. Head is normocephalic and atraumatic. There is no sinus tenderness. PERRLA, EOMI. Oropharynx is clear. Neck is nontender and supple without adenopathy or JVD. Lungs  have coarse expiratory rhonchi and prolonged exhalation phase without rales or wheezes. Heart has regular rate and rhythm without murmur. Abdomen is soft, flat, nontender without masses or hepatosplenomegaly. Jimmy's have no cyanosis or edema, full range of motion is present. Skin is warm to the touch and feels hotter than the recorded temperature. Temperature will be rechecked. No rashes are present. Neurologic: Mental status is normal, cranial nerves are intact, there no focal motor or sensory deficits.  ED Course  Procedures (including critical care time)  Results for orders placed during the hospital encounter of 06/23/11  CBC       Component Value Range   WBC 13.9 (*) 4.0 - 10.5 (K/uL)   RBC 4.78  4.22 - 5.81 (MIL/uL)   Hemoglobin 14.3  13.0 - 17.0 (g/dL)   HCT 40.9  81.1 - 91.4 (%)   MCV 87.7  78.0 - 100.0 (fL)   MCH 29.9  26.0 - 34.0 (pg)   MCHC 34.1  30.0 - 36.0 (g/dL)   RDW 78.2  95.6 - 21.3 (%)   Platelets 241  150 - 400 (K/uL)  DIFFERENTIAL      Component Value Range   Neutrophils Relative 83 (*) 43 - Mitchell (%)   Neutro Abs 11.5 (*) 1.7 - 7.7 (K/uL)   Lymphocytes Relative 10 (*) 12 - 46 (%)   Lymphs Abs 1.4  0.7 - 4.0 (K/uL)   Monocytes Relative 6  3 - 12 (%)   Monocytes Absolute 0.9  0.1 - 1.0 (K/uL)   Eosinophils Relative 1  0 - 5 (%)   Eosinophils Absolute 0.1  0.0 - 0.7 (K/uL)   Basophils Relative 0  0 - 1 (%)   Basophils Absolute 0.0  0.0 - 0.1 (K/uL)  BASIC METABOLIC PANEL      Component Value Range   Sodium 139  135 - 145 (mEq/L)   Potassium 4.2  3.5 - 5.1 (mEq/L)   Chloride 103  96 - 112 (mEq/L)   CO2 24  19 - 32 (mEq/L)   Glucose, Bld 95  70 - 99 (mg/dL)   BUN 23  6 - 23 (mg/dL)   Creatinine, Ser 0.86 (*) 0.50 - 1.35 (mg/dL)   Calcium 9.4  8.4 - 57.8 (mg/dL)   GFR calc non Af Amer 47 (*) >90 (mL/min)   GFR calc Af Amer 55 (*) >90 (mL/min)  D-DIMER, QUANTITATIVE      Component Value Range   D-Dimer, Quant 0.41  0.00 - 0.48 (ug/mL-FEU)  CBC      Component Value Range   WBC 15.3 (*) 4.0 - 10.5 (K/uL)   RBC 4.91  4.22 - 5.81 (MIL/uL)   Hemoglobin 14.4  13.0 - 17.0 (g/dL)   HCT 46.9  62.9 - 52.8 (%)   MCV 86.8  78.0 - 100.0 (fL)   MCH 29.3  26.0 - 34.0 (pg)   MCHC 33.8  30.0 - 36.0 (g/dL)   RDW 41.3  24.4 - 01.0 (%)   Platelets 242  150 - 400 (K/uL)  CREATININE, SERUM      Component Value Range   Creatinine, Ser 1.48 (*) 0.50 - 1.35 (mg/dL)   GFR calc non Af Amer 46 (*) >90 (mL/min)   GFR calc Af Amer 53 (*) >90 (mL/min)   Dg Chest 2 View  06/23/2011  *RADIOLOGY REPORT*  Clinical Data: Shortness of breath, chills, low grade fever.  CHEST - 2 VIEW  Comparison: None  Findings: Mild  peribronchial thickening.  Heart is borderline  in size.  No confluent opacities or effusions.  No acute bony abnormality.  IMPRESSION: Borderline heart size.  Mild bronchitic changes.  Original Report Authenticated By: Cyndie Chime, M.D.    Abba.Bland: After his second albuterol nebulizer treatment, he felt better and lungs were clear. However, oxygen saturation levels are hovering between 90 and 91%. He will be given a third albuterol nebulizer treatment and if his oxygen saturations do not improve, he will need to be admitted.  0300: After his third albuterol nebulizer treatment, oxygen saturations are still staying at mainly 90%. He does not have oxygen at home. He will need to be admitted for supplemental oxygen.  1. Tobacco abuse   2. ERECTILE DYSFUNCTION, ORGANIC   3. HYPERTENSION   4. Hypoxia   5. COPD (chronic obstructive pulmonary disease)   6. COPD with asthma   7. BPH associated with nocturia       MDM  Respiratory tract infection in a patient with COPD. Chest x-ray will be obtained to rule out pneumonia. However, with moderately severe COPD and inability to distinguish initially between viral and bacterial infections, he will be placed on antibiotics empirically - he is given a dose of Avelox. He was given another albuterol nebulizer treatment and reassessed.        Dione Booze, MD 06/24/11 [phone]

## 2011-06-23 NOTE — ED Notes (Signed)
Family at bedside. 

## 2011-06-23 NOTE — ED Notes (Signed)
C/o htn, chills and COPD since 630 this evening. Pt is labored respirations, bilateral wheezes, bp 186/92. Alert and oriented, speaking in short phrases

## 2011-06-24 ENCOUNTER — Encounter (HOSPITAL_COMMUNITY): Payer: Self-pay | Admitting: Family Medicine

## 2011-06-24 DIAGNOSIS — J449 Chronic obstructive pulmonary disease, unspecified: Secondary | ICD-10-CM

## 2011-06-24 DIAGNOSIS — R0902 Hypoxemia: Secondary | ICD-10-CM | POA: Diagnosis present

## 2011-06-24 LAB — DIFFERENTIAL
Basophils Relative: 0 % (ref 0–1)
Eosinophils Absolute: 0.1 10*3/uL (ref 0.0–0.7)
Eosinophils Relative: 1 % (ref 0–5)
Lymphs Abs: 1.4 10*3/uL (ref 0.7–4.0)
Monocytes Relative: 6 % (ref 3–12)

## 2011-06-24 LAB — BASIC METABOLIC PANEL
BUN: 23 mg/dL (ref 6–23)
Calcium: 9.4 mg/dL (ref 8.4–10.5)
GFR calc non Af Amer: 47 mL/min — ABNORMAL LOW (ref >90)
Glucose, Bld: 95 mg/dL (ref 70–99)
Sodium: 139 mEq/L (ref 135–145)

## 2011-06-24 LAB — CBC
HCT: 42.6 % (ref 39.0–52.0)
Hemoglobin: 14.4 g/dL (ref 13.0–17.0)
MCH: 29.3 pg (ref 26.0–34.0)
MCH: 29.9 pg (ref 26.0–34.0)
MCHC: 33.8 g/dL (ref 30.0–36.0)
MCHC: 34.1 g/dL (ref 30.0–36.0)
MCV: 86.8 fL (ref 78.0–100.0)
MCV: 87.7 fL (ref 78.0–100.0)
Platelets: 241 10*3/uL (ref 150–400)
Platelets: 242 K/uL (ref 150–400)
RBC: 4.78 MIL/uL (ref 4.22–5.81)
RBC: 4.91 MIL/uL (ref 4.22–5.81)
RDW: 13.4 % (ref 11.5–15.5)
WBC: 15.3 K/uL — ABNORMAL HIGH (ref 4.0–10.5)

## 2011-06-24 LAB — CREATININE, SERUM
Creatinine, Ser: 1.48 mg/dL — ABNORMAL HIGH (ref 0.50–1.35)
GFR calc Af Amer: 53 mL/min — ABNORMAL LOW
GFR calc non Af Amer: 46 mL/min — ABNORMAL LOW

## 2011-06-24 LAB — D-DIMER, QUANTITATIVE: D-Dimer, Quant: 0.41 ug{FEU}/mL (ref 0.00–0.48)

## 2011-06-24 MED ORDER — MOXIFLOXACIN HCL 400 MG PO TABS: 400.0000 mg | ORAL_TABLET | Freq: Every day | ORAL | Status: DC

## 2011-06-24 MED ORDER — SODIUM CHLORIDE 0.9 % IJ SOLN: 3.0000 mL | INTRAMUSCULAR | Status: DC | PRN

## 2011-06-24 MED ORDER — MOXIFLOXACIN HCL 400 MG PO TABS
400.0000 mg | ORAL_TABLET | Freq: Once | ORAL | Status: AC
  Administered 2011-06-24: 400 mg via ORAL

## 2011-06-24 MED ORDER — IPRATROPIUM BROMIDE 0.02 % IN SOLN
0.5000 mg | Freq: Once | RESPIRATORY_TRACT | Status: AC
  Administered 2011-06-24: 0.5 mg via RESPIRATORY_TRACT
  Filled 2011-06-24: qty 2.5

## 2011-06-24 MED ORDER — LOSARTAN POTASSIUM 50 MG PO TABS
50.0000 mg | ORAL_TABLET | Freq: Two times a day (BID) | ORAL | Status: DC
  Administered 2011-06-24: 50 mg via ORAL
  Filled 2011-06-24 (×2): qty 1

## 2011-06-24 MED ORDER — FLUTICASONE-SALMETEROL 250-50 MCG/DOSE IN AEPB
1.0000 | INHALATION_SPRAY | Freq: Two times a day (BID) | RESPIRATORY_TRACT | Status: DC
  Administered 2011-06-24: 1 via RESPIRATORY_TRACT
  Filled 2011-06-24: qty 14

## 2011-06-24 MED ORDER — MOXIFLOXACIN HCL 400 MG PO TABS
400.0000 mg | ORAL_TABLET | Freq: Every day | ORAL | Status: DC
  Filled 2011-06-24: qty 1

## 2011-06-24 MED ORDER — ALBUTEROL SULFATE (5 MG/ML) 0.5% IN NEBU: 2.5000 mg | INHALATION_SOLUTION | RESPIRATORY_TRACT | Status: DC | PRN

## 2011-06-24 MED ORDER — TIOTROPIUM BROMIDE MONOHYDRATE 18 MCG IN CAPS: 18.0000 ug | ORAL_CAPSULE | Freq: Every day | RESPIRATORY_TRACT | Status: DC

## 2011-06-24 MED ORDER — IBUPROFEN 200 MG PO TABS
ORAL_TABLET | ORAL | Status: AC
  Administered 2011-06-24: 600 mg
  Filled 2011-06-24: qty 3

## 2011-06-24 MED ORDER — LABETALOL HCL 300 MG PO TABS
300.0000 mg | ORAL_TABLET | Freq: Two times a day (BID) | ORAL | Status: DC
  Administered 2011-06-24: 300 mg via ORAL
  Filled 2011-06-24 (×2): qty 1

## 2011-06-24 MED ORDER — ENOXAPARIN SODIUM 40 MG/0.4ML ~~LOC~~ SOLN
40.0000 mg | SUBCUTANEOUS | Status: DC
  Filled 2011-06-24: qty 0.4

## 2011-06-24 MED ORDER — ALBUTEROL SULFATE (5 MG/ML) 0.5% IN NEBU
5.0000 mg | INHALATION_SOLUTION | Freq: Once | RESPIRATORY_TRACT | Status: AC
  Administered 2011-06-24: 5 mg via RESPIRATORY_TRACT
  Filled 2011-06-24: qty 1

## 2011-06-24 MED ORDER — IPRATROPIUM BROMIDE 0.02 % IN SOLN: 0.5000 mg | RESPIRATORY_TRACT | Status: DC | PRN

## 2011-06-24 MED ORDER — ACETAMINOPHEN 325 MG PO TABS
650.0000 mg | ORAL_TABLET | Freq: Once | ORAL | Status: AC
  Administered 2011-06-24: 650 mg via ORAL
  Filled 2011-06-24: qty 2

## 2011-06-24 MED ORDER — ASPIRIN 81 MG PO CHEW
81.0000 mg | CHEWABLE_TABLET | Freq: Every day | ORAL | Status: DC
  Administered 2011-06-24: 81 mg via ORAL
  Filled 2011-06-24: qty 1

## 2011-06-24 MED ORDER — AMLODIPINE BESYLATE 10 MG PO TABS
10.0000 mg | ORAL_TABLET | Freq: Every day | ORAL | Status: DC
  Administered 2011-06-24: 10 mg via ORAL
  Filled 2011-06-24: qty 1

## 2011-06-24 NOTE — Progress Notes (Signed)
CARE MANAGEMENT NOTE 06/24/2011  Patient:  Mitchell Neal, Mitchell Neal   Account Number:  [id number]  Date Initiated:  06/24/2011  Documentation initiated by:  Harlan Vinal  Subjective/Objective Assessment:   71 yo male admitted with hypoxia. PTA pt lived alone.PCP- Kelle Darting, MD.     Action/Plan:   Home when stable   Anticipated DC Date:  06/24/2011   Anticipated DC Plan:  HOME/SELF CARE  In-house referral  NA      DC Planning Services  NA      [facility] Choice  DURABLE MEDICAL EQUIPMENT   Choice offered to / List presented to:  C-1 Patient   DME arranged  OXYGEN      DME agency  [facility]     [facility] arranged  NA      HH agency  NA   Status of service:  Completed, signed off Medicare Important Message given?   (If response is "NO", the following Medicare IM given date fields will be blank) Date Medicare IM given:   Date Additional Medicare IM given:    Discharge Disposition:    Per UR Regulation:    If discussed at Long Length of Stay Meetings, dates discussed:    Comments:  06/24/11 1056 Donalee Gaumond,RN,BSN [phone] CM spoke with pt concerning MD orderfofr home O2 upon discharge. pt qualifies for DME, O2 sat 86% on ra less than 24 hours ago. [facility] notified of DME referral. [facility] to deliver DME to room prior to discharge. Pt agrees will dc plan.

## 2011-06-24 NOTE — H&P (Signed)
PCP:   Evette Georges, MD, MD   Chief Complaint:  Chills   HPI: This is a 71 year old gentleman, who was at home when he developed marked chills. He states he had a fever here in the ER. Mild cough and some wheezing. Patient states he was not short of breath, he denies any chest pain, he denies any pleuritic pain. He came to the ER. Per ER physician patient was wheezing and improved with nebulizers. He had a mild leukocytosis he was given Avelox, discharge arrangements were started. However, patient's oxygen was borderline 90's. The hospitalist this was called to consult. During my interview patient looks great, no wheezing, no complaints. I ambulated the patient on room air, his sats dropped to 86 percent. Patient not on home oxygen. History provided by patient. Patient's a former smoker with COPD on Advair.  Review of Systems: Positives bolded   anorexia, fever, weight loss,, vision loss, decreased hearing, hoarseness, chest pain, syncope, dyspnea on exertion, peripheral edema, balance deficits, hemoptysis, abdominal pain, melena, hematochezia, severe indigestion/heartburn, hematuria, incontinence, genital sores, muscle weakness, suspicious skin lesions, transient blindness, difficulty walking, depression, unusual weight change, abnormal bleeding, enlarged lymph nodes, angioedema, and breast masses.  Past Medical History: Past Medical History  Diagnosis Date  . HYPERTENSION 10/07/2009  . 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 COPD (chronic obstructive pulmonary disease)   . CKD (chronic kidney disease)    Past Surgical History  Procedure Date  . Tonsillectomy     Medications: Prior to Admission medications   Medication Sig Start Date End Date Taking? Authorizing Provider  albuterol (PROVENTIL HFA;VENTOLIN HFA) 108 (90 BASE) MCG/ACT inhaler One puffs 3 times daily 06/12/11  Yes Gordy Savers, MD  amLODipine (NORVASC) 10 MG tablet Take 1 tablet (10 mg total) by mouth daily.  11/10/10  Yes Roderick Pee, MD  Ascorbic Acid (VITAMIN C) 100 MG tablet Take 100 mg by mouth daily.     Yes Historical Provider, MD  aspirin 81 MG EC tablet Take 81 mg by mouth daily.     Yes Historical Provider, MD  fluticasone (FLONASE) 50 MCG/ACT nasal spray Place 2 sprays into the nose daily. 06/12/11 06/11/12 Yes Gordy Savers, MD  Fluticasone-Salmeterol (ADVAIR) 250-50 MCG/DOSE AEPB Inhale 1 puff into the lungs 2 (two) times daily. 06/20/11  Yes Gordy Savers, MD  labetalol (NORMODYNE) 300 MG tablet Take 1 tablet (300 mg total) by mouth 2 (two) times daily. 06/12/11 06/11/12 Yes Gordy Savers, MD  losartan (COZAAR) 50 MG tablet Take 1 tablet (50 mg total) by mouth 2 (two) times daily. 05/26/11  Yes Roderick Pee, MD  Multiple Vitamins-Minerals (MENS MULTI VITAMIN & MINERAL PO) Take by mouth daily.     Yes Historical Provider, MD    Allergies:   Allergies  Allergen Reactions  . Prednisolone     Social History:  reports that he has quit smoking. His smoking use included Cigarettes. He smoked .5 packs per day. He does not have any smokeless tobacco history on file. He reports that he does not drink alcohol or use illicit drugs.  Family History: Family History  Problem Relation Age of Onset  . Hypertension      Physical Exam: Filed Vitals:   06/24/11 0015 06/24/11 0209 06/24/11 0302 06/24/11 0431  BP:    119/60  Pulse:    71  Temp:  99.6 F (37.6 C)    TempSrc:  Oral    Resp:    18  SpO2: 92%  94% 91% 93%    General:  Alert and oriented times three, well developed and nourished, no acute distress Eyes: PERRLA, pink conjunctiva, no scleral icterus ENT: Moist oral mucosa, neck supple, no thyromegaly Lungs: clear to ascultation, no wheeze, no crackles, no use of accessory muscles Cardiovascular: regular rate and rhythm, no regurgitation, no gallops, no murmurs. No carotid bruits, no JVD Abdomen: soft, positive BS, non-tender, non-distended, no organomegaly, not  an acute abdomen GU: not examined Neuro: CN II - XII grossly intact, sensation intact Musculoskeletal: strength 5/5 all extremities, no clubbing, cyanosis or edema Skin: no rash, no subcutaneous crepitation, no decubitus Psych: appropriate patient   Labs on Admission:   Basename 06/24/11  NA 139  K 4.2  CL 103  CO2 24  GLUCOSE 95  BUN 23  CREATININE 1.44*  CALCIUM 9.4  MG --  PHOS --   No results found for this basename: AST:2,ALT:2,ALKPHOS:2,BILITOT:2,PROT:2,ALBUMIN:2 in the last 72 hours No results found for this basename: LIPASE:2,AMYLASE:2 in the last 72 hours  Basename 06/24/11  WBC 13.9*  NEUTROABS 11.5*  HGB 14.3  HCT 41.9  MCV 87.7  PLT 241   No results found for this basename: CKTOTAL:3,CKMB:3,CKMBINDEX:3,TROPONINI:3 in the last 72 hours No components found with this basename: POCBNP:3 No results found for this basename: DDIMER:2 in the last 72 hours No results found for this basename: HGBA1C:2 in the last 72 hours No results found for this basename: CHOL:2,HDL:2,LDLCALC:2,TRIG:2,CHOLHDL:2,LDLDIRECT:2 in the last 72 hours No results found for this basename: TSH,T4TOTAL,FREET3,T3FREE,THYROIDAB in the last 72 hours No results found for this basename: VITAMINB12:2,FOLATE:2,FERRITIN:2,TIBC:2,IRON:2,RETICCTPCT:2 in the last 72 hours  Micro Results: No results found for this or any previous visit (from the past 240 hour(s)).   Radiological Exams on Admission: Dg Chest 2 View  06/23/2011  *RADIOLOGY REPORT*  Clinical Data: Shortness of breath, chills, low grade fever.  CHEST - 2 VIEW  Comparison: None  Findings: Mild peribronchial thickening.  Heart is borderline in size.  No confluent opacities or effusions.  No acute bony abnormality.  IMPRESSION: Borderline heart size.  Mild bronchitic changes.  Original Report Authenticated By: Cyndie Chime, M.D.    Assessment/Plan Present on Admission:  .Hypoxia Admit to observation status  Patient with hypoxia on  ambulation  Start oxygen  to keep sats greater than 88% Patient currently with no wheeze, continue home Advair and nebulizers.  Will add a d-dimer, but doubt PE  .HYPERTENSION Chronic kidney disease  Stable, resume home medications   Likely discharge in a.m.   Full code DVT prophylaxis T3/Dr. Sammuel Cooper, Jaedan Schuman 06/24/2011, 5:16 AM

## 2011-06-24 NOTE — Progress Notes (Signed)
Pt 95% on RA at rest. Pt desated to 83% RA while ambulating. Oxygen reapplied and O2 sat increased to 95% 2L [place].

## 2011-06-24 NOTE — Discharge Summary (Signed)
Physician Discharge Summary  Patient ID: Mitchell Neal MRN: [id number] DOB/AGE: July 23, 1940 71 y.o.  Admit date: 06/23/2011 Discharge date: 06/24/2011  Primary Care Physician:  Evette Georges, MD, MD   Discharge Diagnoses:    Principal Problem:  *Hypoxia Active Problems:  HYPERTENSION  COPD (chronic obstructive pulmonary disease)  Bronchitis    Medication List  As of 06/24/2011  8:28 AM   TAKE these medications         albuterol 108 (90 BASE) MCG/ACT inhaler   Commonly known as: PROVENTIL HFA;VENTOLIN HFA   One puffs 3 times daily      amLODipine 10 MG tablet   Commonly known as: NORVASC   Take 1 tablet (10 mg total) by mouth daily.      aspirin 81 MG EC tablet   Take 81 mg by mouth daily.      fluticasone 50 MCG/ACT nasal spray   Commonly known as: FLONASE   Place 2 sprays into the nose daily.      Fluticasone-Salmeterol 250-50 MCG/DOSE Aepb   Commonly known as: ADVAIR   Inhale 1 puff into the lungs 2 (two) times daily.      labetalol 300 MG tablet   Commonly known as: NORMODYNE   Take 1 tablet (300 mg total) by mouth 2 (two) times daily.      losartan 50 MG tablet   Commonly known as: COZAAR   Take 1 tablet (50 mg total) by mouth 2 (two) times daily.      MENS MULTI VITAMIN & MINERAL PO   Take by mouth daily.      moxifloxacin 400 MG tablet   Commonly known as: AVELOX   Take 1 tablet (400 mg total) by mouth daily.      tiotropium 18 MCG inhalation capsule   Commonly known as: SPIRIVA   Place 1 capsule (18 mcg total) into inhaler and inhale daily.      vitamin C 100 MG tablet   Take 100 mg by mouth daily.             Disposition and Follow-up:  Will be discharged home today in stable and improved condition. Has been set up with home oxygen given ambulating sats of 86% on RA. Have asked him to followup in 3-4 weeks with his PCP to evaluate him for continued oxygen necessity.  Consults:  None    Significant Diagnostic Studies:  Results for  orders placed during the hospital encounter of 06/23/11 (from the past 48 hour(s))  CBC     Status: Abnormal   Collection Time   06/24/11 12:00 AM      Component Value Range Comment   WBC 13.9 (*) 4.0 - 10.5 (K/uL)    RBC 4.78  4.22 - 5.81 (MIL/uL)    Hemoglobin 14.3  13.0 - 17.0 (g/dL)    HCT 40.9  81.1 - 91.4 (%)    MCV 87.7  78.0 - 100.0 (fL)    MCH 29.9  26.0 - 34.0 (pg)    MCHC 34.1  30.0 - 36.0 (g/dL)    RDW 78.2  95.6 - 21.3 (%)    Platelets 241  150 - 400 (K/uL)   DIFFERENTIAL     Status: Abnormal   Collection Time   06/24/11 12:00 AM      Component Value Range Comment   Neutrophils Relative 83 (*) 43 - 77 (%)    Neutro Abs 11.5 (*) 1.7 - 7.7 (K/uL)    Lymphocytes Relative 10 (*) 12 -  46 (%)    Lymphs Abs 1.4  0.7 - 4.0 (K/uL)    Monocytes Relative 6  3 - 12 (%)    Monocytes Absolute 0.9  0.1 - 1.0 (K/uL)    Eosinophils Relative 1  0 - 5 (%)    Eosinophils Absolute 0.1  0.0 - 0.7 (K/uL)    Basophils Relative 0  0 - 1 (%)    Basophils Absolute 0.0  0.0 - 0.1 (K/uL)   BASIC METABOLIC PANEL     Status: Abnormal   Collection Time   06/24/11 12:00 AM      Component Value Range Comment   Sodium 139  135 - 145 (mEq/L)    Potassium 4.2  3.5 - 5.1 (mEq/L)    Chloride 103  96 - 112 (mEq/L)    CO2 24  19 - 32 (mEq/L)    Glucose, Bld 95  70 - 99 (mg/dL)    BUN 23  6 - 23 (mg/dL)    Creatinine, Ser 4.09 (*) 0.50 - 1.35 (mg/dL)    Calcium 9.4  8.4 - 10.5 (mg/dL)    GFR calc non Af Amer 47 (*) >90 (mL/min)    GFR calc Af Amer 55 (*) >90 (mL/min)   CBC     Status: Abnormal   Collection Time   06/24/11  8:15 AM      Component Value Range Comment   WBC 15.3 (*) 4.0 - 10.5 (K/uL)    RBC 4.91  4.22 - 5.81 (MIL/uL)    Hemoglobin 14.4  13.0 - 17.0 (g/dL)    HCT 81.1  91.4 - 78.2 (%)    MCV 86.8  78.0 - 100.0 (fL)    MCH 29.3  26.0 - 34.0 (pg)    MCHC 33.8  30.0 - 36.0 (g/dL)    RDW 95.6  21.3 - 08.6 (%)    Platelets 242  150 - 400 (K/uL)    Dg Chest 2 View  06/23/2011   *RADIOLOGY REPORT*  Clinical Data: Shortness of breath, chills, low grade fever.  CHEST - 2 VIEW  Comparison: None  Findings: Mild peribronchial thickening.  Heart is borderline in size.  No confluent opacities or effusions.  No acute bony abnormality.  IMPRESSION: Borderline heart size.  Mild bronchitic changes.  Original Report Authenticated By: Cyndie Chime, M.D.     Brief H and P: For complete details please refer to admission H and P, but in brief this is a 71 year old gentleman who is a former smoker and has a h/o COPD, who was at home when he developed marked chills. He states he had a fever here in the ER. Mild cough and some wheezing. Patient states he was not short of breath, he denies any chest pain, he denies any pleuritic pain. He came to the ER. Per ER physician patient was wheezing and improved with nebulizers. He had a mild leukocytosis he was given Avelox, discharge arrangements were started. However, patient's oxygen was borderline 90's. The hospitalist this was called to consult. During my interview patient looks great, no wheezing, no complaints. I ambulated the patient on room air, his sats dropped to 86 percent. Patient not on home oxygen.We were asked to admit him for further evaluation and management.     Hospital Course:  Principal Problem:  *Hypoxia Active Problems:  HYPERTENSION  COPD (chronic obstructive pulmonary disease)   #1 Acute Bronchitis: Avelox for 5 days. Has not had a fever while in the hospital.  #2 COPD: will  add Spiriva to his Advair. Continue albuterol MDI as a rescue inhaler. Will set up with home oxygen given ambulating sats of 86% on RA.  Rest of chronic issues have been stable this admission.  Time spent on Discharge: Greater than 30 minutes.  SignedChaya Jan [facility] Pager: [phone] 06/24/2011, 8:28 AM

## 2011-07-05 ENCOUNTER — Encounter: Payer: Self-pay | Admitting: Family Medicine

## 2011-07-05 ENCOUNTER — Ambulatory Visit: Payer: Medicare Other | Admitting: Family Medicine

## 2011-07-05 ENCOUNTER — Ambulatory Visit (INDEPENDENT_AMBULATORY_CARE_PROVIDER_SITE_OTHER): Payer: Medicare Other | Admitting: Family Medicine

## 2011-07-05 VITALS — BP 150/84 | HR 59 | Temp 98.2°F | Wt 204.0 lb

## 2011-07-05 DIAGNOSIS — R0902 Hypoxemia: Secondary | ICD-10-CM

## 2011-07-05 DIAGNOSIS — D729 Disorder of white blood cells, unspecified: Secondary | ICD-10-CM

## 2011-07-05 DIAGNOSIS — J449 Chronic obstructive pulmonary disease, unspecified: Secondary | ICD-10-CM

## 2011-07-05 DIAGNOSIS — I10 Essential (primary) hypertension: Secondary | ICD-10-CM

## 2011-07-05 NOTE — Progress Notes (Signed)
  Subjective:    Patient ID: Mitchell Neal, male    DOB: 03/03/1941, 71 y.o.   MRN: [id number]  HPI Mitchell Neal is a 71 year old single male who comes in today accompanied by his daughter for followup of a hospitalization for COPD and hypoxia  On March 29 he went to the emergency room because of fever chills and shortness of breath. Chest x-ray showed no pneumonia however he was hospitalized for couple days because of hypoxia. O2 sat dropped to 86%  He was sent home on 2 L of oxygen O2 sat today off oxygen 97%  They gave him Spiriva however he stopped it because he states that it made his legs hurt  He states he feels well and is back to baseline.  Blood pressure at home 130/70     Review of Systems    general and pulmonary review of systems otherwise negative Objective:   Physical Exam Well-developed well-nourished thin male in no acute distress examination of the lungs shows decreased breath sounds bilaterally consistent with COPD no wheezing on forced expiration       Assessment & Plan:  COPD with hypoxia resolved plan continue current therapy followup in 3 months

## 2011-07-05 NOTE — Patient Instructions (Signed)
Stop the oxygen  Continue your current medications  Hold the Spiriva for now  Return in 3 months for followup sooner if any problems

## 2011-07-26 ENCOUNTER — Other Ambulatory Visit: Payer: Self-pay | Admitting: Internal Medicine

## 2011-07-26 ENCOUNTER — Other Ambulatory Visit (HOSPITAL_COMMUNITY): Payer: Self-pay | Admitting: Internal Medicine

## 2011-07-26 DIAGNOSIS — J449 Chronic obstructive pulmonary disease, unspecified: Secondary | ICD-10-CM

## 2011-07-26 NOTE — Telephone Encounter (Signed)
Dr Todd pt. 

## 2011-08-04 ENCOUNTER — Ambulatory Visit (HOSPITAL_COMMUNITY)
Admission: RE | Admit: 2011-08-04 | Discharge: 2011-08-04 | Disposition: A | Discharge status: Home / Self Care | Payer: Medicare Other | Source: Ambulatory Visit | Attending: Internal Medicine | Admitting: Internal Medicine

## 2011-08-04 DIAGNOSIS — I10 Essential (primary) hypertension: Secondary | ICD-10-CM | POA: Insufficient documentation

## 2011-08-04 DIAGNOSIS — N529 Male erectile dysfunction, unspecified: Secondary | ICD-10-CM | POA: Insufficient documentation

## 2011-08-04 DIAGNOSIS — R0902 Hypoxemia: Secondary | ICD-10-CM | POA: Insufficient documentation

## 2011-08-04 DIAGNOSIS — F17200 Nicotine dependence, unspecified, uncomplicated: Secondary | ICD-10-CM | POA: Insufficient documentation

## 2011-08-04 DIAGNOSIS — J449 Chronic obstructive pulmonary disease, unspecified: Secondary | ICD-10-CM | POA: Insufficient documentation

## 2011-08-04 DIAGNOSIS — J4489 Other specified chronic obstructive pulmonary disease: Secondary | ICD-10-CM | POA: Insufficient documentation

## 2011-08-04 MED ORDER — ALBUTEROL SULFATE (5 MG/ML) 0.5% IN NEBU
2.5000 mg | INHALATION_SOLUTION | Freq: Once | RESPIRATORY_TRACT | Status: AC
  Administered 2011-08-04: 2.5 mg via RESPIRATORY_TRACT

## 2011-10-04 ENCOUNTER — Ambulatory Visit: Payer: Medicare Other | Admitting: Family Medicine

## 2011-12-06 ENCOUNTER — Other Ambulatory Visit: Payer: Self-pay | Admitting: Family Medicine

## 2011-12-18 ENCOUNTER — Other Ambulatory Visit: Payer: Self-pay | Admitting: *Deleted

## 2011-12-18 MED ORDER — FLUTICASONE-SALMETEROL 250-50 MCG/DOSE IN AEPB: 1.0000 | INHALATION_SPRAY | Freq: Two times a day (BID) | RESPIRATORY_TRACT | Status: DC

## 2011-12-19 ENCOUNTER — Other Ambulatory Visit: Payer: Self-pay | Admitting: Family Medicine

## 2012-01-30 ENCOUNTER — Other Ambulatory Visit: Payer: Self-pay | Admitting: Internal Medicine

## 2012-01-30 NOTE — Telephone Encounter (Signed)
Dr Todd pt. 

## 2012-05-29 ENCOUNTER — Other Ambulatory Visit: Payer: Self-pay | Admitting: Family Medicine

## 2012-09-02 IMAGING — CR DG CHEST 2V
2 series · 2 of 2 positions shown · non-contrast
Comparison: None

CLINICAL DATA: Shortness of breath, chills, low grade fever.

CHEST - 2 VIEW

[w chest pa]
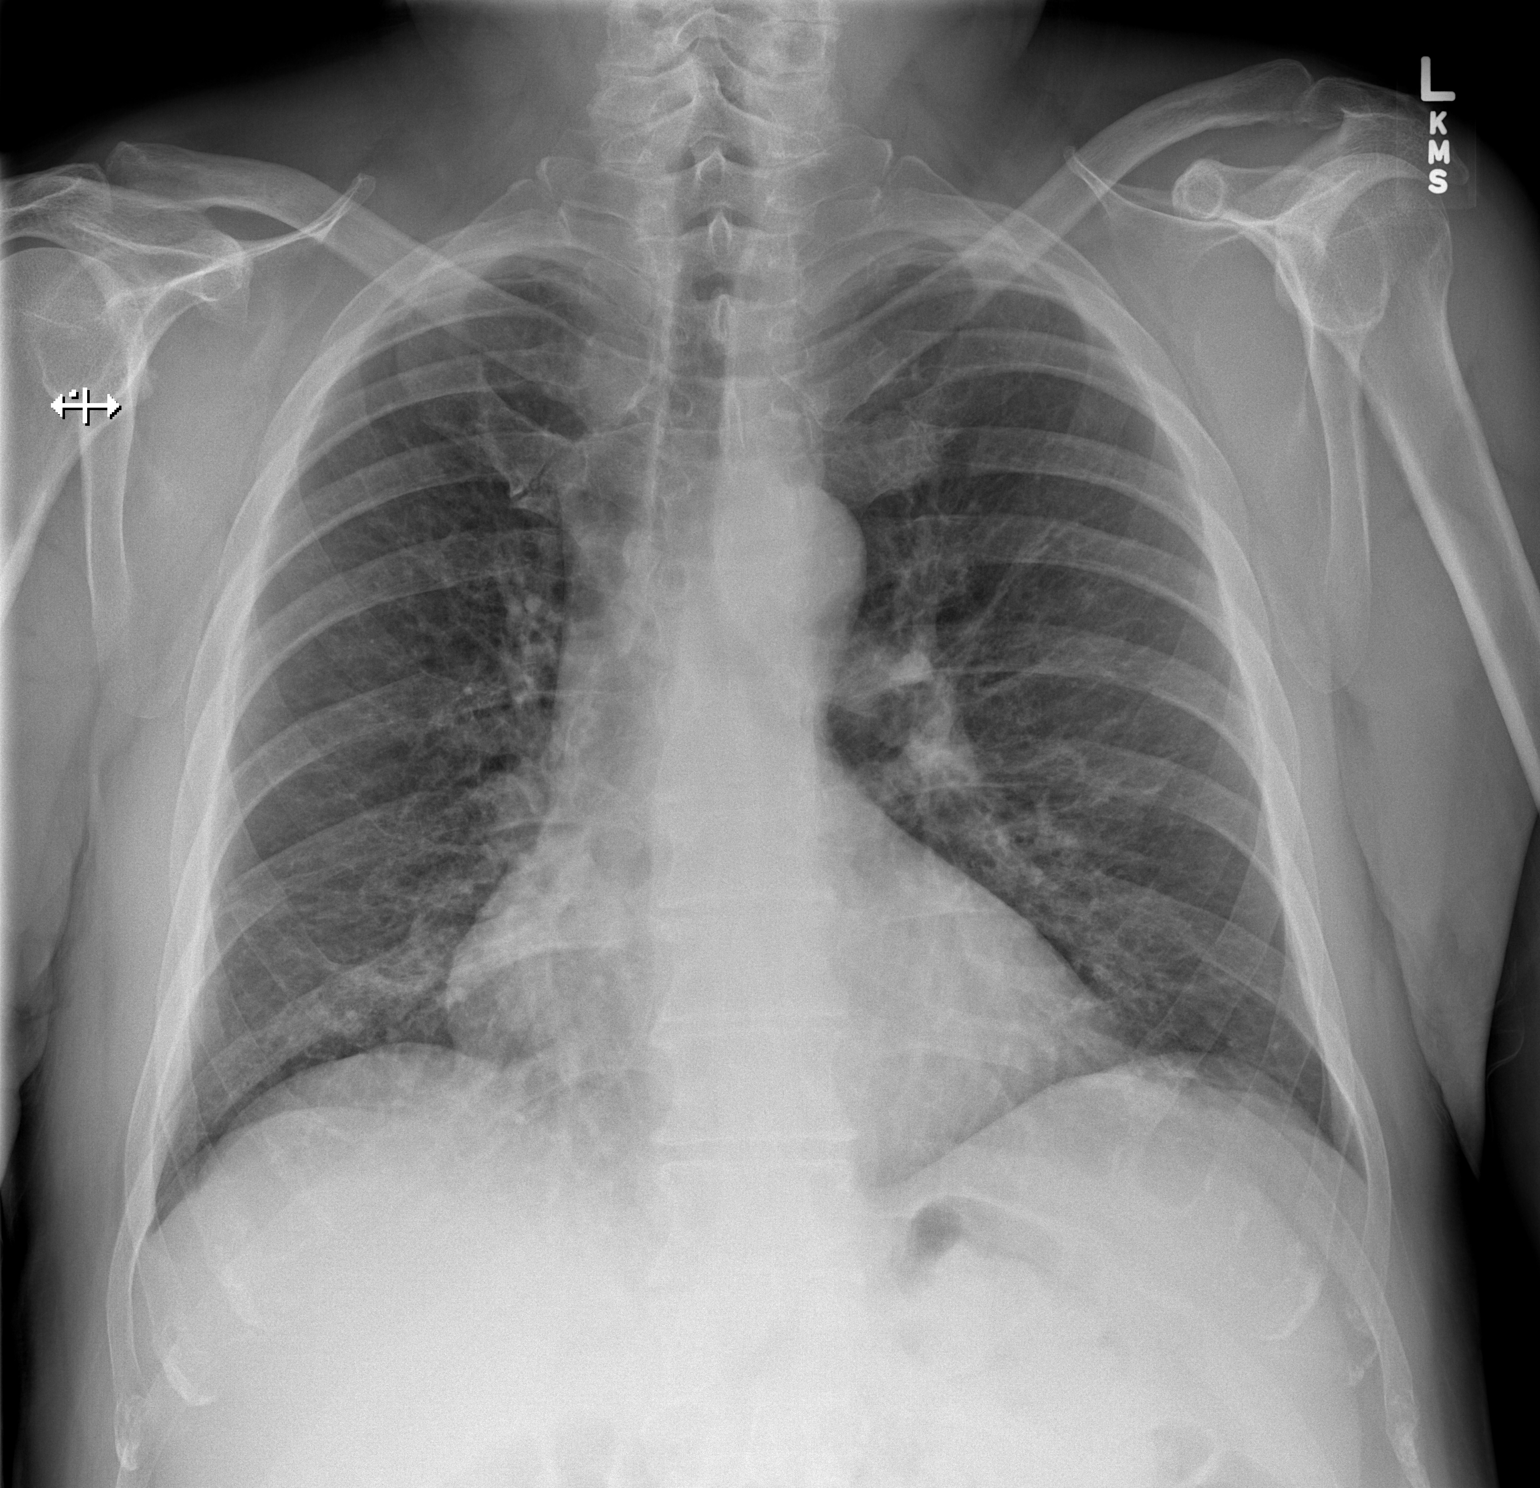

[w chest lat]
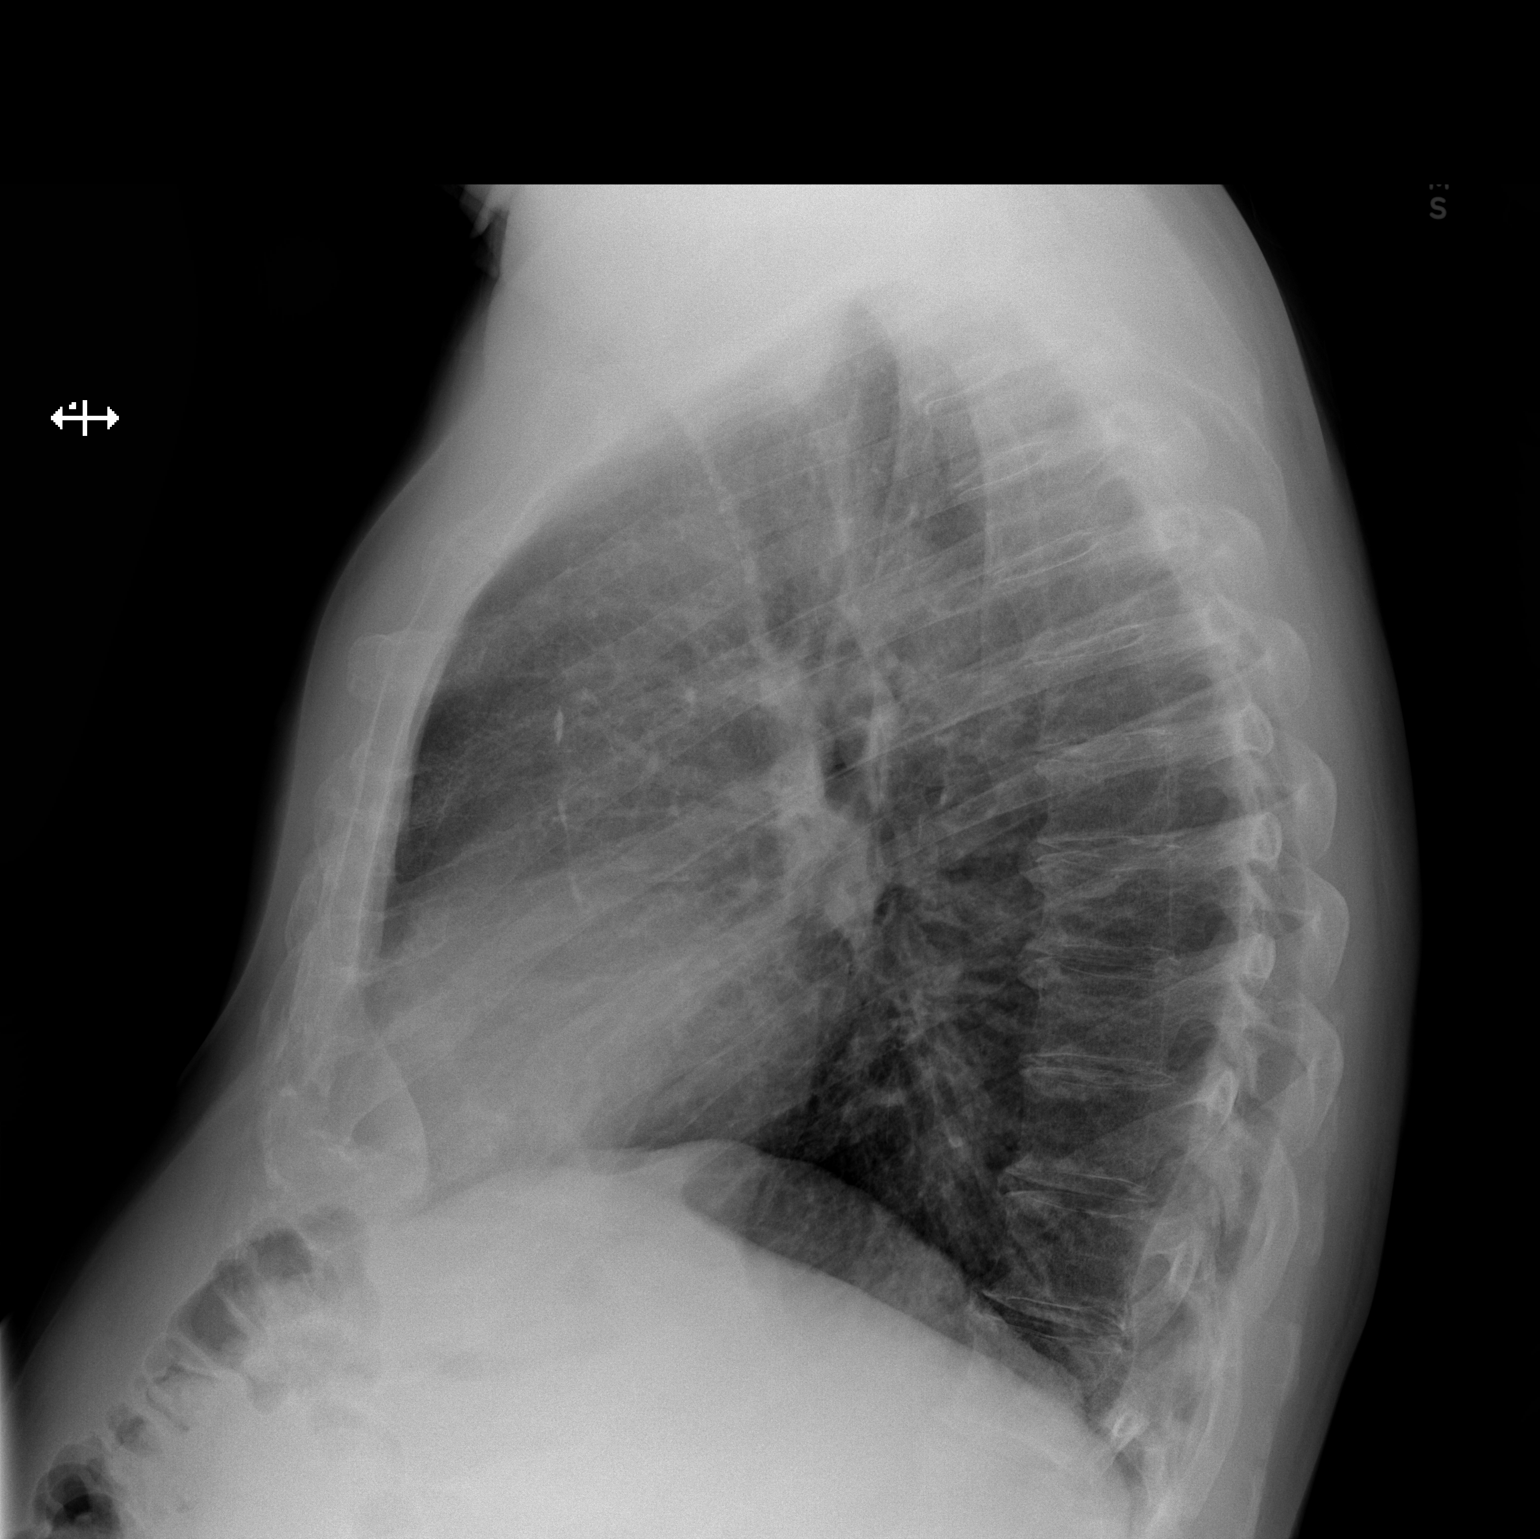

[2 of 2 positions shown; findings below may reference images not displayed]

FINDINGS: Mild peribronchial thickening.  Heart is borderline in
size.  No confluent opacities or effusions.  No acute bony
abnormality.
IMPRESSION: Borderline heart size.  Mild bronchitic changes.

## 2012-10-08 ENCOUNTER — Encounter: Payer: Self-pay | Admitting: Gastroenterology

## 2013-07-15 ENCOUNTER — Encounter: Payer: Self-pay | Admitting: Gastroenterology

## 2015-06-10 DIAGNOSIS — I10 Essential (primary) hypertension: Secondary | ICD-10-CM | POA: Diagnosis not present

## 2015-06-10 DIAGNOSIS — Z1389 Encounter for screening for other disorder: Secondary | ICD-10-CM | POA: Diagnosis not present

## 2015-06-10 DIAGNOSIS — F418 Other specified anxiety disorders: Secondary | ICD-10-CM | POA: Diagnosis not present

## 2015-06-10 DIAGNOSIS — K760 Fatty (change of) liver, not elsewhere classified: Secondary | ICD-10-CM | POA: Diagnosis not present

## 2015-06-10 DIAGNOSIS — Z6829 Body mass index (BMI) 29.0-29.9, adult: Secondary | ICD-10-CM | POA: Diagnosis not present

## 2015-06-10 DIAGNOSIS — E668 Other obesity: Secondary | ICD-10-CM | POA: Diagnosis not present

## 2015-06-10 DIAGNOSIS — J302 Other seasonal allergic rhinitis: Secondary | ICD-10-CM | POA: Diagnosis not present

## 2015-06-10 DIAGNOSIS — J449 Chronic obstructive pulmonary disease, unspecified: Secondary | ICD-10-CM | POA: Diagnosis not present

## 2015-12-08 DIAGNOSIS — Z125 Encounter for screening for malignant neoplasm of prostate: Secondary | ICD-10-CM | POA: Diagnosis not present

## 2015-12-08 DIAGNOSIS — I10 Essential (primary) hypertension: Secondary | ICD-10-CM | POA: Diagnosis not present

## 2015-12-08 DIAGNOSIS — N390 Urinary tract infection, site not specified: Secondary | ICD-10-CM | POA: Diagnosis not present

## 2015-12-15 DIAGNOSIS — J302 Other seasonal allergic rhinitis: Secondary | ICD-10-CM | POA: Diagnosis not present

## 2015-12-15 DIAGNOSIS — Z0000 Encounter for general adult medical examination without abnormal findings: Secondary | ICD-10-CM | POA: Diagnosis not present

## 2015-12-15 DIAGNOSIS — J449 Chronic obstructive pulmonary disease, unspecified: Secondary | ICD-10-CM | POA: Diagnosis not present

## 2015-12-15 DIAGNOSIS — I10 Essential (primary) hypertension: Secondary | ICD-10-CM | POA: Diagnosis not present

## 2015-12-15 DIAGNOSIS — E668 Other obesity: Secondary | ICD-10-CM | POA: Diagnosis not present

## 2015-12-15 DIAGNOSIS — K760 Fatty (change of) liver, not elsewhere classified: Secondary | ICD-10-CM | POA: Diagnosis not present

## 2015-12-15 DIAGNOSIS — N183 Chronic kidney disease, stage 3 (moderate): Secondary | ICD-10-CM | POA: Diagnosis not present

## 2015-12-15 DIAGNOSIS — F331 Major depressive disorder, recurrent, moderate: Secondary | ICD-10-CM | POA: Diagnosis not present

## 2015-12-15 DIAGNOSIS — R808 Other proteinuria: Secondary | ICD-10-CM | POA: Diagnosis not present

## 2015-12-15 DIAGNOSIS — Z23 Encounter for immunization: Secondary | ICD-10-CM | POA: Diagnosis not present

## 2015-12-17 ENCOUNTER — Encounter: Payer: Self-pay | Admitting: Gastroenterology

## 2015-12-17 DIAGNOSIS — Z1212 Encounter for screening for malignant neoplasm of rectum: Secondary | ICD-10-CM | POA: Diagnosis not present

## 2016-02-02 ENCOUNTER — Ambulatory Visit (AMBULATORY_SURGERY_CENTER): Payer: Self-pay | Admitting: *Deleted

## 2016-02-02 VITALS — Ht 70.0 in | Wt 215.0 lb

## 2016-02-02 DIAGNOSIS — Z86010 Personal history of colonic polyps: Secondary | ICD-10-CM

## 2016-02-02 MED ORDER — NA SULFATE-K SULFATE-MG SULF 17.5-3.13-1.6 GM/177ML PO SOLN: 1.0000 | Freq: Once | ORAL | 0 refills | Status: AC

## 2016-02-02 NOTE — Progress Notes (Signed)
No egg or soy allergy known to patient  No issues with past sedation with any surgeries  or procedures, no intubation problems  No diet pills per patient No home 02 use per patient  No blood thinners per patient  Pt denies issues with constipation  No A fib or A flutter   

## 2016-02-04 ENCOUNTER — Encounter: Payer: Self-pay | Admitting: Gastroenterology

## 2016-02-16 ENCOUNTER — Ambulatory Visit (AMBULATORY_SURGERY_CENTER): Payer: Medicare Other | Admitting: Gastroenterology

## 2016-02-16 ENCOUNTER — Encounter: Payer: Self-pay | Admitting: Gastroenterology

## 2016-02-16 VITALS — BP 95/72 | HR 66 | Temp 98.0°F | Resp 22 | Ht 70.0 in | Wt 215.0 lb

## 2016-02-16 DIAGNOSIS — I10 Essential (primary) hypertension: Secondary | ICD-10-CM | POA: Diagnosis not present

## 2016-02-16 DIAGNOSIS — Z86010 Personal history of colonic polyps: Secondary | ICD-10-CM

## 2016-02-16 DIAGNOSIS — J449 Chronic obstructive pulmonary disease, unspecified: Secondary | ICD-10-CM | POA: Diagnosis not present

## 2016-02-16 DIAGNOSIS — D123 Benign neoplasm of transverse colon: Secondary | ICD-10-CM

## 2016-02-16 DIAGNOSIS — D120 Benign neoplasm of cecum: Secondary | ICD-10-CM | POA: Diagnosis not present

## 2016-02-16 DIAGNOSIS — D124 Benign neoplasm of descending colon: Secondary | ICD-10-CM | POA: Diagnosis not present

## 2016-02-16 DIAGNOSIS — E669 Obesity, unspecified: Secondary | ICD-10-CM | POA: Diagnosis not present

## 2016-02-16 DIAGNOSIS — D122 Benign neoplasm of ascending colon: Secondary | ICD-10-CM

## 2016-02-16 DIAGNOSIS — Z1211 Encounter for screening for malignant neoplasm of colon: Secondary | ICD-10-CM | POA: Diagnosis not present

## 2016-02-16 MED ORDER — SODIUM CHLORIDE 0.9 % IV SOLN
500.0000 mL | INTRAVENOUS | Status: DC
Start: 2016-02-16 — End: 2017-02-21

## 2016-02-16 NOTE — Progress Notes (Signed)
Called to room to assist during endoscopic procedure.  Patient ID and intended procedure confirmed with present staff. Received instructions for my participation in the procedure from the performing physician.  

## 2016-02-16 NOTE — Progress Notes (Signed)
To recovery, report to Hodges, RN, VSS 

## 2016-02-16 NOTE — Patient Instructions (Addendum)
YOU HAD AN ENDOSCOPIC PROCEDURE TODAY AT [facility]:   Refer to the procedure report that was given to you for any specific questions about what was found during the examination.  If the procedure report does not answer your questions, please call your gastroenterologist to clarify.  If you requested that your care partner not be given the details of your procedure findings, then the procedure report has been included in a sealed envelope for you to review at your convenience later.  YOU SHOULD EXPECT: Some feelings of bloating in the abdomen. Passage of more gas than usual.  Walking can help get rid of the air that was put into your GI tract during the procedure and reduce the bloating. If you had a lower endoscopy (such as a colonoscopy or flexible sigmoidoscopy) you may notice spotting of blood in your stool or on the toilet paper. If you underwent a bowel prep for your procedure, you may not have a normal bowel movement for a few days.  Please Note:  You might notice some irritation and congestion in your nose or some drainage.  This is from the oxygen used during your procedure.  There is no need for concern and it should clear up in a day or so.  SYMPTOMS TO REPORT IMMEDIATELY:   Following lower endoscopy (colonoscopy or flexible sigmoidoscopy):  Excessive amounts of blood in the stool  Significant tenderness or worsening of abdominal pains  Swelling of the abdomen that is new, acute  Fever of 100F or higher   Following upper endoscopy (EGD)  Vomiting of blood or coffee ground material  New chest pain or pain under the shoulder blades  Painful or persistently difficult swallowing  New shortness of breath  Fever of 100F or higher  Black, tarry-looking stools  For urgent or emergent issues, a gastroenterologist can be reached at any hour by calling [phone].   DIET:  We do recommend a small meal at first, but then you may proceed to your regular diet.  Drink  plenty of fluids but you should avoid alcoholic beverages for 24 hours.  ACTIVITY:  You should plan to take it easy for the rest of today and you should NOT DRIVE or use heavy machinery until tomorrow (because of the sedation medicines used during the test).    FOLLOW UP: Our staff will call the number listed on your records the next business day following your procedure to check on you and address any questions or concerns that you may have regarding the information given to you following your procedure. If we do not reach you, we will leave a message.  However, if you are feeling well and you are not experiencing any problems, there is no need to return our call.  We will assume that you have returned to your regular daily activities without incident.  If any biopsies were taken you will be contacted by phone or by letter within the next 1-3 weeks.  Please call us at [phone] if you have not heard about the biopsies in 3 weeks.    SIGNATURES/CONFIDENTIALITY: You and/or your care partner have signed paperwork which will be entered into your electronic medical record.  These signatures attest to the fact that that the information above on your After Visit Summary has been reviewed and is understood.  Full responsibility of the confidentiality of this discharge information lies with you and/or your care-partner.YOU HAD AN ENDOSCOPIC PROCEDURE TODAY AT [facility]:  Refer to the procedure report that was given to you for any specific questions about what was found during the examination.  If the procedure report does not answer your questions, please call your gastroenterologist to clarify.  If you requested that your care partner not be given the details of your procedure findings, then the procedure report has been included in a sealed envelope for you to review at your convenience later.  YOU SHOULD EXPECT: Some feelings of bloating in the abdomen. Passage of more gas than  usual.  Walking can help get rid of the air that was put into your GI tract during the procedure and reduce the bloating. If you had a lower endoscopy (such as a colonoscopy or flexible sigmoidoscopy) you may notice spotting of blood in your stool or on the toilet paper. If you underwent a bowel prep for your procedure, you may not have a normal bowel movement for a few days.  Please Note:  You might notice some irritation and congestion in your nose or some drainage.  This is from the oxygen used during your procedure.  There is no need for concern and it should clear up in a day or so.  SYMPTOMS TO REPORT IMMEDIATELY:   Following lower endoscopy (colonoscopy or flexible sigmoidoscopy):  Excessive amounts of blood in the stool  Significant tenderness or worsening of abdominal pains  Swelling of the abdomen that is new, acute  Fever of 100F or higher  For urgent or emergent issues, a gastroenterologist can be reached at any hour by calling [phone].   DIET:  The doctor wants you to stay on a soft diet for 2 days.  Drink plenty of fluids but you should avoid alcoholic beverages for 24 hours. Try to increase the fiber in your diet.  ACTIVITY:  You should plan to take it easy for the rest of today and you should NOT DRIVE or use heavy machinery until tomorrow (because of the sedation medicines used during the test).    FOLLOW UP: Our staff will call the number listed on your records the next business day following your procedure to check on you and address any questions or concerns that you may have regarding the information given to you following your procedure. If we do not reach you, we will leave a message.  However, if you are feeling well and you are not experiencing any problems, there is no need to return our call.  We will assume that you have returned to your regular daily activities without incident.  If any biopsies were taken you will be contacted by phone or by letter within  the next 1-3 weeks.  Please call us at [phone] if you have not heard about the biopsies in 3 weeks.    SIGNATURES/CONFIDENTIALITY: You and/or your care partner have signed paperwork which will be entered into your electronic medical record.  These signatures attest to the fact that that the information above on your After Visit Summary has been reviewed and is understood.  Full responsibility of the confidentiality of this discharge information lies with you and/or your care-partner.  NO NSAIDS FOR TWO WEEKS PER DR NANDIGAM DUE TO THE RISK OF BLEEDING. You will need another colonoscopy in 1 year due to a poor prep and many polyps.  yOU DO HAVE A CLIP IN YOUR BODY.  yOU HAVE BEEN GIVEN A CARD FOR YOUR WALLET

## 2016-02-16 NOTE — Progress Notes (Signed)
OPA placed d/t intermittent airway obstruction with significant improvement. Sa02 stable.

## 2016-02-16 NOTE — Op Note (Signed)
[facility] Patient Name: Mitchell Neal Procedure Date: 02/16/2016 9:42 AM MRN: [id number] Endoscopist: Mauri Pole , MD Age: 75 Referring MD:  Date of Birth: 01/17/1941 Gender: Male Account #: [id number] Procedure:                Colonoscopy Indications:              High risk colon cancer surveillance: Personal                            history of colonic polyps, Last colonoscopy: 2011 Medicines:                Monitored Anesthesia Care Procedure:                Pre-Anesthesia Assessment:                           - Prior to the procedure, a History and Physical                            was performed, and patient medications and                            allergies were reviewed. The patient's tolerance of                            previous anesthesia was also reviewed. The risks                            and benefits of the procedure and the sedation                            options and risks were discussed with the patient.                            All questions were answered, and informed consent                            was obtained. Prior Anticoagulants: The patient has                            taken no previous anticoagulant or antiplatelet                            agents. ASA Grade Assessment: II - A patient with                            mild systemic disease. After reviewing the risks                            and benefits, the patient was deemed in                            satisfactory condition to undergo the procedure.  After obtaining informed consent, the colonoscope                            was passed under direct vision. Throughout the                            procedure, the patient's blood pressure, pulse, and                            oxygen saturations were monitored continuously. The                            Model CF-HQ190L [id number]) scope was introduced                            through the  anus and advanced to the the terminal                            ileum, with identification of the appendiceal                            orifice and IC valve. The colonoscopy was performed                            without difficulty. The patient tolerated the                            procedure well. The quality of the bowel                            preparation was fair. The terminal ileum, ileocecal                            valve, appendiceal orifice, and rectum were                            photographed. Scope In: 9:49:01 AM Scope Out: 10:24:58 AM Scope Withdrawal Time: 0 hours 11 minutes 57 seconds  Total Procedure Duration: 0 hours 35 minutes 57 seconds  Findings:                 The perianal and digital rectal examinations were                            normal.                           A 18 mm polyp was found in the transverse colon.                            The polyp was sessile. The polyp was removed with a                            hot snare. The polyp was removed with a piecemeal  technique using a hot snare. Resection and                            retrieval were complete. To prevent bleeding after                            the polypectomy, two hemostatic clips were                            successfully placed. There was no bleeding at the                            end of the procedure.                           A 9 mm polyp was found in the cecum. The polyp was                            sessile. The polyp was removed with a hot snare.                            Resection and retrieval were complete.                           Two sessile polyps were found in the descending                            colon and ascending colon. The polyps were 5 to 6                            mm in size. These polyps were removed with a cold                            snare. Resection and retrieval were complete.                           A 2 mm  polyp was found in the transverse colon. The                            polyp was sessile. The polyp was removed with a                            cold biopsy forceps. Resection and retrieval were                            complete.                           Multiple small and large-mouthed diverticula were                            found in the sigmoid colon and descending colon.  Non-bleeding internal hemorrhoids were found during                            retroflexion. The hemorrhoids were medium-sized. Complications:            No immediate complications. Estimated Blood Loss:     Estimated blood loss was minimal. Impression:               - Preparation of the colon was fair.                           - One 18 mm polyp in the transverse colon, removed                            with a hot snare and removed piecemeal using a hot                            snare. Resected and retrieved. Clips were placed.                           - One 9 mm polyp in the cecum, removed with a hot                            snare. Resected and retrieved.                           - Two 5 to 6 mm polyps in the descending colon and                            in the ascending colon, removed with a cold snare.                            Resected and retrieved.                           - One 2 mm polyp in the transverse colon, removed                            with a cold biopsy forceps. Resected and retrieved.                           - Diverticulosis in the sigmoid colon and in the                            descending colon.                           - Non-bleeding internal hemorrhoids. Recommendation:           - Patient has a contact number available for                            emergencies. The signs and symptoms of potential  delayed complications were discussed with the                            patient. Return to normal activities tomorrow.                             Written discharge instructions were provided to the                            patient.                           - Mechanical soft diet for 2 days.                           - No ibuprofen, naproxen, or other non-steroidal                            anti-inflammatory drugs for 2 weeks.                           - Await pathology results.                           - Repeat colonoscopy in 1 year because the bowel                            preparation was poor and for surveillance after                            piecemeal polypectomy. Mauri Pole, MD 02/16/2016 10:37:15 AM This report has been signed electronically.

## 2016-02-21 ENCOUNTER — Telehealth: Payer: Self-pay

## 2016-02-21 ENCOUNTER — Telehealth: Payer: Self-pay | Admitting: *Deleted

## 2016-02-21 NOTE — Telephone Encounter (Signed)
  Follow up Call-  Call back number 02/16/2016  Post procedure Call Back phone  # [phone]  Permission to leave phone message Yes  Some recent data might be hidden     Patient questions:  Do you have a fever, pain , or abdominal swelling? No. Pain Score  0 *  Have you tolerated food without any problems? Yes.    Have you been able to return to your normal activities? Yes.    Do you have any questions about your discharge instructions: Diet   No. Medications  No. Follow up visit  No.  Do you have questions or concerns about your Care? No.  Actions: * If pain score is 4 or above: No action needed, pain <4.

## 2016-02-21 NOTE — Telephone Encounter (Signed)
  Follow up Call-  Call back number 02/16/2016  Post procedure Call Back phone  # [phone]  Permission to leave phone message Yes  Some recent data might be hidden    [facility]

## 2016-02-28 ENCOUNTER — Encounter: Payer: Self-pay | Admitting: Gastroenterology

## 2016-06-06 DIAGNOSIS — K760 Fatty (change of) liver, not elsewhere classified: Secondary | ICD-10-CM | POA: Diagnosis not present

## 2016-06-06 DIAGNOSIS — R808 Other proteinuria: Secondary | ICD-10-CM | POA: Diagnosis not present

## 2016-06-06 DIAGNOSIS — N183 Chronic kidney disease, stage 3 (moderate): Secondary | ICD-10-CM | POA: Diagnosis not present

## 2016-06-06 DIAGNOSIS — E668 Other obesity: Secondary | ICD-10-CM | POA: Diagnosis not present

## 2016-12-13 DIAGNOSIS — R8299 Other abnormal findings in urine: Secondary | ICD-10-CM | POA: Diagnosis not present

## 2016-12-13 DIAGNOSIS — Z125 Encounter for screening for malignant neoplasm of prostate: Secondary | ICD-10-CM | POA: Diagnosis not present

## 2016-12-13 DIAGNOSIS — N390 Urinary tract infection, site not specified: Secondary | ICD-10-CM | POA: Diagnosis not present

## 2016-12-13 DIAGNOSIS — I10 Essential (primary) hypertension: Secondary | ICD-10-CM | POA: Diagnosis not present

## 2016-12-20 DIAGNOSIS — N183 Chronic kidney disease, stage 3 (moderate): Secondary | ICD-10-CM | POA: Diagnosis not present

## 2016-12-20 DIAGNOSIS — J449 Chronic obstructive pulmonary disease, unspecified: Secondary | ICD-10-CM | POA: Diagnosis not present

## 2016-12-20 DIAGNOSIS — R808 Other proteinuria: Secondary | ICD-10-CM | POA: Diagnosis not present

## 2016-12-20 DIAGNOSIS — Z0000 Encounter for general adult medical examination without abnormal findings: Secondary | ICD-10-CM | POA: Diagnosis not present

## 2016-12-21 ENCOUNTER — Encounter: Payer: Self-pay | Admitting: Gastroenterology

## 2016-12-22 DIAGNOSIS — Z0000 Encounter for general adult medical examination without abnormal findings: Secondary | ICD-10-CM | POA: Diagnosis not present

## 2016-12-22 DIAGNOSIS — N183 Chronic kidney disease, stage 3 (moderate): Secondary | ICD-10-CM | POA: Diagnosis not present

## 2016-12-22 DIAGNOSIS — R808 Other proteinuria: Secondary | ICD-10-CM | POA: Diagnosis not present

## 2016-12-22 DIAGNOSIS — J449 Chronic obstructive pulmonary disease, unspecified: Secondary | ICD-10-CM | POA: Diagnosis not present

## 2016-12-27 DIAGNOSIS — Z1212 Encounter for screening for malignant neoplasm of rectum: Secondary | ICD-10-CM | POA: Diagnosis not present

## 2017-02-07 ENCOUNTER — Ambulatory Visit (AMBULATORY_SURGERY_CENTER): Payer: Self-pay | Admitting: *Deleted

## 2017-02-07 ENCOUNTER — Other Ambulatory Visit: Payer: Self-pay

## 2017-02-07 VITALS — Ht 69.5 in | Wt 213.6 lb

## 2017-02-07 DIAGNOSIS — Z86010 Personal history of colonic polyps: Secondary | ICD-10-CM

## 2017-02-07 MED ORDER — SUPREP BOWEL PREP KIT 17.5-3.13-1.6 GM/177ML PO SOLN: 1.0000 | Freq: Once | ORAL | 0 refills | Status: AC

## 2017-02-07 NOTE — Progress Notes (Signed)
Patient denies any allergies to egg or soy products. Patient denies complications with anesthesia/sedation.  Patient denies oxygen use at home and denies diet medications. Patient denies information on colonoscopy. 

## 2017-02-08 ENCOUNTER — Encounter: Payer: Self-pay | Admitting: Gastroenterology

## 2017-02-08 ENCOUNTER — Telehealth: Payer: Self-pay | Admitting: Gastroenterology

## 2017-02-08 NOTE — Telephone Encounter (Signed)
Returned call to patient.  His insurance is [employer].  Agreed to return to [facility] and pick up a free Suprep Sample at 4th floor receptionist's desk. Angela/PV

## 2017-02-21 ENCOUNTER — Encounter: Payer: Self-pay | Admitting: Gastroenterology

## 2017-02-21 ENCOUNTER — Ambulatory Visit (AMBULATORY_SURGERY_CENTER): Payer: Medicare Other | Admitting: Gastroenterology

## 2017-02-21 ENCOUNTER — Other Ambulatory Visit: Payer: Self-pay

## 2017-02-21 VITALS — BP 130/61 | HR 59 | Temp 98.4°F | Resp 14 | Ht 69.5 in | Wt 213.0 lb

## 2017-02-21 DIAGNOSIS — Z1211 Encounter for screening for malignant neoplasm of colon: Secondary | ICD-10-CM | POA: Diagnosis not present

## 2017-02-21 DIAGNOSIS — Z86010 Personal history of colonic polyps: Secondary | ICD-10-CM

## 2017-02-21 DIAGNOSIS — D123 Benign neoplasm of transverse colon: Secondary | ICD-10-CM

## 2017-02-21 DIAGNOSIS — J449 Chronic obstructive pulmonary disease, unspecified: Secondary | ICD-10-CM | POA: Diagnosis not present

## 2017-02-21 DIAGNOSIS — D122 Benign neoplasm of ascending colon: Secondary | ICD-10-CM

## 2017-02-21 DIAGNOSIS — I10 Essential (primary) hypertension: Secondary | ICD-10-CM | POA: Diagnosis not present

## 2017-02-21 MED ORDER — SODIUM CHLORIDE 0.9 % IV SOLN: 500.0000 mL | INTRAVENOUS | Status: DC

## 2017-02-21 NOTE — Op Note (Signed)
[place] Patient Name: Mitchell Neal Procedure Date: 02/21/2017 11:37 AM MRN: [id number] Endoscopist: Mauri Pole , MD Age: 76 Referring MD:  Date of Birth: 1940-11-08 Gender: Male Account #: [id number] Procedure:                Colonoscopy Indications:              Surveillance: Piecemeal removal of large sessile                            adenoma last colonoscopy (< 3 yrs) Medicines:                Monitored Anesthesia Care Procedure:                Pre-Anesthesia Assessment:                           - Prior to the procedure, a History and Physical                            was performed, and patient medications and                            allergies were reviewed. The patient's tolerance of                            previous anesthesia was also reviewed. The risks                            and benefits of the procedure and the sedation                            options and risks were discussed with the patient.                            All questions were answered, and informed consent                            was obtained. Prior Anticoagulants: The patient has                            taken no previous anticoagulant or antiplatelet                            agents. ASA Grade Assessment: III - A patient with                            severe systemic disease. After reviewing the risks                            and benefits, the patient was deemed in                            satisfactory condition to undergo the procedure.  After obtaining informed consent, the colonoscope                            was passed under direct vision. Throughout the                            procedure, the patient's blood pressure, pulse, and                            oxygen saturations were monitored continuously. The                            Model PCF-[id number]) scope was introduced                            through the anus  and advanced to the the cecum,                            identified by appendiceal orifice and ileocecal                            valve. The colonoscopy was performed without                            difficulty. The patient tolerated the procedure                            well. The quality of the bowel preparation was                            adequate. The ileocecal valve, appendiceal orifice,                            and rectum were photographed. Scope In: 11:48:21 AM Scope Out: 12:04:37 PM Scope Withdrawal Time: 0 hours 13 minutes 24 seconds  Total Procedure Duration: 0 hours 16 minutes 16 seconds  Findings:                 The perianal and digital rectal examinations were                            normal.                           Two sessile polyps were found in the transverse                            colon and ascending colon. The polyps were 4 to 5                            mm in size. These polyps were removed with a cold                            snare. Resection and retrieval were complete.  Three sessile polyps were found in the transverse                            colon and ascending colon. The polyps were 1 to 2                            mm in size. These polyps were removed with a cold                            biopsy forceps. Resection and retrieval were                            complete.                           Multiple small and large-mouthed diverticula were                            found in the sigmoid colon, descending colon,                            transverse colon and ascending colon.                           Non-bleeding internal hemorrhoids were found during                            retroflexion. The hemorrhoids were small. Complications:            No immediate complications. Estimated Blood Loss:     Estimated blood loss was minimal. Impression:               - Two 4 to 5 mm polyps in the transverse colon  and                            in the ascending colon, removed with a cold snare.                            Resected and retrieved.                           - Three 1 to 2 mm polyps in the transverse colon                            and in the ascending colon, removed with a cold                            biopsy forceps. Resected and retrieved.                           - Diverticulosis in the sigmoid colon, in the                            descending colon, in the transverse colon and in  the ascending colon.                           - Non-bleeding internal hemorrhoids. Recommendation:           - Patient has a contact number available for                            emergencies. The signs and symptoms of potential                            delayed complications were discussed with the                            patient. Return to normal activities tomorrow.                            Written discharge instructions were provided to the                            patient.                           - Resume previous diet.                           - Continue present medications.                           - Await pathology results.                           - Repeat colonoscopy in 3 - 5 years for                            surveillance based on pathology results. Mauri Pole, MD 02/21/2017 12:10:58 PM This report has been signed electronically.

## 2017-02-21 NOTE — Progress Notes (Signed)
Called to room to assist during endoscopic procedure.  Patient ID and intended procedure confirmed with present staff. Received instructions for my participation in the procedure from the performing physician.Called to room to assist during endoscopic procedure.  Patient ID and intended procedure confirmed with present staff. Received instructions for my participation in the procedure from the performing physician. 

## 2017-02-21 NOTE — Progress Notes (Signed)
Report given to PACU, vss 

## 2017-02-21 NOTE — Patient Instructions (Signed)
YOU HAD AN ENDOSCOPIC PROCEDURE TODAY AT [facility]:   Refer to the procedure report that was given to you for any specific questions about what was found during the examination.  If the procedure report does not answer your questions, please call your gastroenterologist to clarify.  If you requested that your care partner not be given the details of your procedure findings, then the procedure report has been included in a sealed envelope for you to review at your convenience later.  YOU SHOULD EXPECT: Some feelings of bloating in the abdomen. Passage of more gas than usual.  Walking can help get rid of the air that was put into your GI tract during the procedure and reduce the bloating. If you had a lower endoscopy (such as a colonoscopy or flexible sigmoidoscopy) you may notice spotting of blood in your stool or on the toilet paper. If you underwent a bowel prep for your procedure, you may not have a normal bowel movement for a few days.  Please Note:  You might notice some irritation and congestion in your nose or some drainage.  This is from the oxygen used during your procedure.  There is no need for concern and it should clear up in a day or so.  SYMPTOMS TO REPORT IMMEDIATELY:   Following lower endoscopy (colonoscopy or flexible sigmoidoscopy):  Excessive amounts of blood in the stool  Significant tenderness or worsening of abdominal pains  Swelling of the abdomen that is new, acute  Fever of 100F or higher  For urgent or emergent issues, a gastroenterologist can be reached at any hour by calling [phone].   DIET:  We do recommend a small meal at first, but then you may proceed to your regular diet.  Drink plenty of fluids but you should avoid alcoholic beverages for 24 hours.  ACTIVITY:  You should plan to take it easy for the rest of today and you should NOT DRIVE or use heavy machinery until tomorrow (because of the sedation medicines used during the test).     FOLLOW UP: Our staff will call the number listed on your records the next business day following your procedure to check on you and address any questions or concerns that you may have regarding the information given to you following your procedure. If we do not reach you, we will leave a message.  However, if you are feeling well and you are not experiencing any problems, there is no need to return our call.  We will assume that you have returned to your regular daily activities without incident.  If any biopsies were taken you will be contacted by phone or by letter within the next 1-3 weeks.  Please call us at [phone] if you have not heard about the biopsies in 3 weeks.   Await for biopsy results to determine next repeat Colonoscopy screening Hemorrhoids (handout given) Polyps (handout given) Diverticulosis(handout given)   SIGNATURES/CONFIDENTIALITY: You and/or your care partner have signed paperwork which will be entered into your electronic medical record.  These signatures attest to the fact that that the information above on your After Visit Summary has been reviewed and is understood.  Full responsibility of the confidentiality of this discharge information lies with you and/or your care-partner.

## 2017-02-22 ENCOUNTER — Telehealth: Payer: Self-pay | Admitting: *Deleted

## 2017-02-22 NOTE — Telephone Encounter (Signed)
  Follow up Call-  Call back number 02/21/2017 02/16/2016  Post procedure Call Back phone  # [phone]  Permission to leave phone message Yes Yes  Some recent data might be hidden     Patient questions:  Do you have a fever, pain , or abdominal swelling? No. Pain Score  0 *  Have you tolerated food without any problems? Yes.    Have you been able to return to your normal activities? Yes.    Do you have any questions about your discharge instructions: Diet   No. Medications  No. Follow up visit  No.  Do you have questions or concerns about your Care? No.  Actions: * If pain score is 4 or above: No action needed, pain <4.

## 2017-02-27 ENCOUNTER — Encounter: Payer: Self-pay | Admitting: Gastroenterology

## 2017-06-13 DIAGNOSIS — K760 Fatty (change of) liver, not elsewhere classified: Secondary | ICD-10-CM | POA: Diagnosis not present

## 2017-06-13 DIAGNOSIS — N183 Chronic kidney disease, stage 3 (moderate): Secondary | ICD-10-CM | POA: Diagnosis not present

## 2017-06-13 DIAGNOSIS — R808 Other proteinuria: Secondary | ICD-10-CM | POA: Diagnosis not present

## 2017-06-13 DIAGNOSIS — E668 Other obesity: Secondary | ICD-10-CM | POA: Diagnosis not present

## 2017-12-19 DIAGNOSIS — R82998 Other abnormal findings in urine: Secondary | ICD-10-CM | POA: Diagnosis not present

## 2017-12-19 DIAGNOSIS — Z125 Encounter for screening for malignant neoplasm of prostate: Secondary | ICD-10-CM | POA: Diagnosis not present

## 2017-12-19 DIAGNOSIS — I10 Essential (primary) hypertension: Secondary | ICD-10-CM | POA: Diagnosis not present

## 2017-12-20 DIAGNOSIS — H25043 Posterior subcapsular polar age-related cataract, bilateral: Secondary | ICD-10-CM | POA: Diagnosis not present

## 2017-12-26 DIAGNOSIS — R808 Other proteinuria: Secondary | ICD-10-CM | POA: Diagnosis not present

## 2017-12-26 DIAGNOSIS — Z0000 Encounter for general adult medical examination without abnormal findings: Secondary | ICD-10-CM | POA: Diagnosis not present

## 2017-12-26 DIAGNOSIS — N183 Chronic kidney disease, stage 3 (moderate): Secondary | ICD-10-CM | POA: Diagnosis not present

## 2017-12-26 DIAGNOSIS — I10 Essential (primary) hypertension: Secondary | ICD-10-CM | POA: Diagnosis not present

## 2017-12-27 DIAGNOSIS — H25043 Posterior subcapsular polar age-related cataract, bilateral: Secondary | ICD-10-CM | POA: Diagnosis not present

## 2018-01-04 DIAGNOSIS — Z1212 Encounter for screening for malignant neoplasm of rectum: Secondary | ICD-10-CM | POA: Diagnosis not present

## 2018-01-22 DIAGNOSIS — H25041 Posterior subcapsular polar age-related cataract, right eye: Secondary | ICD-10-CM | POA: Diagnosis not present

## 2018-01-22 DIAGNOSIS — H2511 Age-related nuclear cataract, right eye: Secondary | ICD-10-CM | POA: Diagnosis not present

## 2018-01-22 DIAGNOSIS — H25811 Combined forms of age-related cataract, right eye: Secondary | ICD-10-CM | POA: Diagnosis not present

## 2018-06-25 DIAGNOSIS — F331 Major depressive disorder, recurrent, moderate: Secondary | ICD-10-CM | POA: Diagnosis not present

## 2018-06-25 DIAGNOSIS — E669 Obesity, unspecified: Secondary | ICD-10-CM | POA: Diagnosis not present

## 2018-06-25 DIAGNOSIS — R809 Proteinuria, unspecified: Secondary | ICD-10-CM | POA: Diagnosis not present

## 2018-06-25 DIAGNOSIS — N183 Chronic kidney disease, stage 3 (moderate): Secondary | ICD-10-CM | POA: Diagnosis not present

## 2018-12-24 DIAGNOSIS — I129 Hypertensive chronic kidney disease with stage 1 through stage 4 chronic kidney disease, or unspecified chronic kidney disease: Secondary | ICD-10-CM | POA: Diagnosis not present

## 2018-12-24 DIAGNOSIS — Z125 Encounter for screening for malignant neoplasm of prostate: Secondary | ICD-10-CM | POA: Diagnosis not present

## 2018-12-27 DIAGNOSIS — Z23 Encounter for immunization: Secondary | ICD-10-CM | POA: Diagnosis not present

## 2018-12-27 DIAGNOSIS — N1832 Chronic kidney disease, stage 3b: Secondary | ICD-10-CM | POA: Diagnosis not present

## 2018-12-27 DIAGNOSIS — R82998 Other abnormal findings in urine: Secondary | ICD-10-CM | POA: Diagnosis not present

## 2019-01-03 DIAGNOSIS — Z1212 Encounter for screening for malignant neoplasm of rectum: Secondary | ICD-10-CM | POA: Diagnosis not present

## 2019-04-04 DIAGNOSIS — H5203 Hypermetropia, bilateral: Secondary | ICD-10-CM | POA: Diagnosis not present

## 2019-05-12 ENCOUNTER — Other Ambulatory Visit: Payer: Self-pay

## 2019-05-12 ENCOUNTER — Ambulatory Visit: Payer: Medicare Other | Attending: Internal Medicine

## 2019-05-12 DIAGNOSIS — Z23 Encounter for immunization: Secondary | ICD-10-CM

## 2019-05-12 NOTE — Progress Notes (Signed)
   Covid-19 Vaccination Clinic  Name:  Mitchell Neal    MRN: [id number] DOB: 1940/10/05  05/12/2019  [name] was observed post Covid-19 immunization for 15 minutes without incidence. He was provided with Vaccine Information Sheet and instruction to access the V-Safe system.   [name] was instructed to call 911 with any severe reactions post vaccine: Marland Kitchen Difficulty breathing  . Swelling of your face and throat  . A fast heartbeat  . A bad rash all over your body  . Dizziness and weakness    Immunizations Administered    Name Date Dose VIS Date Route   Moderna COVID-19 Vaccine 05/12/2019 11:45 AM 0.5 mL 02/25/2019 Intramuscular   Manufacturer: Moderna   Lot: [id number]   [place]: 97673-419-37

## 2019-05-13 DIAGNOSIS — H25042 Posterior subcapsular polar age-related cataract, left eye: Secondary | ICD-10-CM | POA: Diagnosis not present

## 2019-05-13 DIAGNOSIS — H2512 Age-related nuclear cataract, left eye: Secondary | ICD-10-CM | POA: Diagnosis not present

## 2019-05-13 DIAGNOSIS — H5703 Miosis: Secondary | ICD-10-CM | POA: Diagnosis not present

## 2019-05-13 DIAGNOSIS — H25812 Combined forms of age-related cataract, left eye: Secondary | ICD-10-CM | POA: Diagnosis not present

## 2019-06-10 ENCOUNTER — Ambulatory Visit: Payer: Medicare Other | Attending: Internal Medicine

## 2019-06-10 DIAGNOSIS — Z23 Encounter for immunization: Secondary | ICD-10-CM

## 2019-06-10 NOTE — Progress Notes (Signed)
   Covid-19 Vaccination Clinic  Name:  Mitchell Neal    MRN: [id number] DOB: 14-Feb-1941  06/10/2019  [name] was observed post Covid-19 immunization for 15 minutes without incident. He was provided with Vaccine Information Sheet and instruction to access the V-Safe system.   [name] was instructed to call 911 with any severe reactions post vaccine: Marland Kitchen Difficulty breathing  . Swelling of face and throat  . A fast heartbeat  . A bad rash all over body  . Dizziness and weakness   Immunizations Administered    Name Date Dose VIS Date Route   Moderna COVID-19 Vaccine 06/10/2019 12:23 PM 0.5 mL 02/25/2019 Intramuscular   Manufacturer: Moderna   Lot: [id number]   [place]: [id number]

## 2019-07-11 DIAGNOSIS — F331 Major depressive disorder, recurrent, moderate: Secondary | ICD-10-CM | POA: Diagnosis not present

## 2019-07-11 DIAGNOSIS — K760 Fatty (change of) liver, not elsewhere classified: Secondary | ICD-10-CM | POA: Diagnosis not present

## 2019-07-11 DIAGNOSIS — J302 Other seasonal allergic rhinitis: Secondary | ICD-10-CM | POA: Diagnosis not present

## 2019-07-11 DIAGNOSIS — J449 Chronic obstructive pulmonary disease, unspecified: Secondary | ICD-10-CM | POA: Diagnosis not present

## 2019-12-25 DIAGNOSIS — N1832 Chronic kidney disease, stage 3b: Secondary | ICD-10-CM | POA: Diagnosis not present

## 2019-12-25 DIAGNOSIS — Z125 Encounter for screening for malignant neoplasm of prostate: Secondary | ICD-10-CM | POA: Diagnosis not present

## 2019-12-25 DIAGNOSIS — I129 Hypertensive chronic kidney disease with stage 1 through stage 4 chronic kidney disease, or unspecified chronic kidney disease: Secondary | ICD-10-CM | POA: Diagnosis not present

## 2020-01-01 DIAGNOSIS — Z0000 Encounter for general adult medical examination without abnormal findings: Secondary | ICD-10-CM | POA: Diagnosis not present

## 2020-01-01 DIAGNOSIS — N1832 Chronic kidney disease, stage 3b: Secondary | ICD-10-CM | POA: Diagnosis not present

## 2020-01-01 DIAGNOSIS — R809 Proteinuria, unspecified: Secondary | ICD-10-CM | POA: Diagnosis not present

## 2020-01-01 DIAGNOSIS — I10 Essential (primary) hypertension: Secondary | ICD-10-CM | POA: Diagnosis not present

## 2020-01-01 DIAGNOSIS — R82998 Other abnormal findings in urine: Secondary | ICD-10-CM | POA: Diagnosis not present

## 2020-01-01 DIAGNOSIS — I129 Hypertensive chronic kidney disease with stage 1 through stage 4 chronic kidney disease, or unspecified chronic kidney disease: Secondary | ICD-10-CM | POA: Diagnosis not present

## 2020-01-15 DIAGNOSIS — Z1212 Encounter for screening for malignant neoplasm of rectum: Secondary | ICD-10-CM | POA: Diagnosis not present

## 2020-03-21 DIAGNOSIS — R0602 Shortness of breath: Secondary | ICD-10-CM | POA: Diagnosis not present

## 2020-03-21 DIAGNOSIS — I10 Essential (primary) hypertension: Secondary | ICD-10-CM | POA: Diagnosis not present

## 2020-03-21 DIAGNOSIS — R918 Other nonspecific abnormal finding of lung field: Secondary | ICD-10-CM | POA: Diagnosis not present

## 2020-03-21 DIAGNOSIS — Z7982 Long term (current) use of aspirin: Secondary | ICD-10-CM | POA: Diagnosis not present

## 2020-03-21 DIAGNOSIS — I517 Cardiomegaly: Secondary | ICD-10-CM | POA: Diagnosis not present

## 2020-03-21 DIAGNOSIS — R079 Chest pain, unspecified: Secondary | ICD-10-CM | POA: Diagnosis not present

## 2020-03-21 DIAGNOSIS — Z79899 Other long term (current) drug therapy: Secondary | ICD-10-CM | POA: Diagnosis not present

## 2020-03-21 DIAGNOSIS — E876 Hypokalemia: Secondary | ICD-10-CM | POA: Diagnosis not present

## 2020-03-21 DIAGNOSIS — U071 COVID-19: Secondary | ICD-10-CM | POA: Diagnosis not present

## 2020-03-21 DIAGNOSIS — I445 Left posterior fascicular block: Secondary | ICD-10-CM | POA: Diagnosis not present

## 2020-03-21 DIAGNOSIS — J449 Chronic obstructive pulmonary disease, unspecified: Secondary | ICD-10-CM | POA: Diagnosis not present

## 2020-05-11 ENCOUNTER — Encounter: Payer: Self-pay | Admitting: Gastroenterology

## 2020-06-11 DIAGNOSIS — H52223 Regular astigmatism, bilateral: Secondary | ICD-10-CM | POA: Diagnosis not present

## 2020-07-12 DIAGNOSIS — J449 Chronic obstructive pulmonary disease, unspecified: Secondary | ICD-10-CM | POA: Diagnosis not present

## 2020-07-12 DIAGNOSIS — I129 Hypertensive chronic kidney disease with stage 1 through stage 4 chronic kidney disease, or unspecified chronic kidney disease: Secondary | ICD-10-CM | POA: Diagnosis not present

## 2020-07-12 DIAGNOSIS — N1832 Chronic kidney disease, stage 3b: Secondary | ICD-10-CM | POA: Diagnosis not present

## 2020-07-12 DIAGNOSIS — F331 Major depressive disorder, recurrent, moderate: Secondary | ICD-10-CM | POA: Diagnosis not present

## 2020-10-24 DIAGNOSIS — I129 Hypertensive chronic kidney disease with stage 1 through stage 4 chronic kidney disease, or unspecified chronic kidney disease: Secondary | ICD-10-CM | POA: Diagnosis not present

## 2020-10-24 DIAGNOSIS — J449 Chronic obstructive pulmonary disease, unspecified: Secondary | ICD-10-CM | POA: Diagnosis not present

## 2020-10-24 DIAGNOSIS — N1832 Chronic kidney disease, stage 3b: Secondary | ICD-10-CM | POA: Diagnosis not present

## 2020-10-24 DIAGNOSIS — F331 Major depressive disorder, recurrent, moderate: Secondary | ICD-10-CM | POA: Diagnosis not present

## 2020-11-24 DIAGNOSIS — J449 Chronic obstructive pulmonary disease, unspecified: Secondary | ICD-10-CM | POA: Diagnosis not present

## 2020-11-24 DIAGNOSIS — I129 Hypertensive chronic kidney disease with stage 1 through stage 4 chronic kidney disease, or unspecified chronic kidney disease: Secondary | ICD-10-CM | POA: Diagnosis not present

## 2020-11-24 DIAGNOSIS — F331 Major depressive disorder, recurrent, moderate: Secondary | ICD-10-CM | POA: Diagnosis not present

## 2020-11-24 DIAGNOSIS — N1832 Chronic kidney disease, stage 3b: Secondary | ICD-10-CM | POA: Diagnosis not present

## 2020-12-24 DIAGNOSIS — I129 Hypertensive chronic kidney disease with stage 1 through stage 4 chronic kidney disease, or unspecified chronic kidney disease: Secondary | ICD-10-CM | POA: Diagnosis not present

## 2020-12-24 DIAGNOSIS — F331 Major depressive disorder, recurrent, moderate: Secondary | ICD-10-CM | POA: Diagnosis not present

## 2020-12-24 DIAGNOSIS — N1832 Chronic kidney disease, stage 3b: Secondary | ICD-10-CM | POA: Diagnosis not present

## 2020-12-24 DIAGNOSIS — J449 Chronic obstructive pulmonary disease, unspecified: Secondary | ICD-10-CM | POA: Diagnosis not present

## 2021-01-05 DIAGNOSIS — I10 Essential (primary) hypertension: Secondary | ICD-10-CM | POA: Diagnosis not present

## 2021-01-05 DIAGNOSIS — Z125 Encounter for screening for malignant neoplasm of prostate: Secondary | ICD-10-CM | POA: Diagnosis not present

## 2021-01-12 DIAGNOSIS — Z1212 Encounter for screening for malignant neoplasm of rectum: Secondary | ICD-10-CM | POA: Diagnosis not present

## 2021-01-12 DIAGNOSIS — I129 Hypertensive chronic kidney disease with stage 1 through stage 4 chronic kidney disease, or unspecified chronic kidney disease: Secondary | ICD-10-CM | POA: Diagnosis not present

## 2021-01-12 DIAGNOSIS — R82998 Other abnormal findings in urine: Secondary | ICD-10-CM | POA: Diagnosis not present

## 2021-04-24 DIAGNOSIS — I129 Hypertensive chronic kidney disease with stage 1 through stage 4 chronic kidney disease, or unspecified chronic kidney disease: Secondary | ICD-10-CM | POA: Diagnosis not present

## 2021-04-24 DIAGNOSIS — N1832 Chronic kidney disease, stage 3b: Secondary | ICD-10-CM | POA: Diagnosis not present

## 2021-04-24 DIAGNOSIS — J449 Chronic obstructive pulmonary disease, unspecified: Secondary | ICD-10-CM | POA: Diagnosis not present

## 2021-06-13 DIAGNOSIS — H52203 Unspecified astigmatism, bilateral: Secondary | ICD-10-CM | POA: Diagnosis not present

## 2021-07-27 DIAGNOSIS — N1832 Chronic kidney disease, stage 3b: Secondary | ICD-10-CM | POA: Diagnosis not present

## 2021-07-27 DIAGNOSIS — R809 Proteinuria, unspecified: Secondary | ICD-10-CM | POA: Diagnosis not present

## 2021-07-27 DIAGNOSIS — J449 Chronic obstructive pulmonary disease, unspecified: Secondary | ICD-10-CM | POA: Diagnosis not present

## 2021-07-27 DIAGNOSIS — I129 Hypertensive chronic kidney disease with stage 1 through stage 4 chronic kidney disease, or unspecified chronic kidney disease: Secondary | ICD-10-CM | POA: Diagnosis not present

## 2021-12-05 DIAGNOSIS — E871 Hypo-osmolality and hyponatremia: Secondary | ICD-10-CM | POA: Diagnosis not present

## 2021-12-05 DIAGNOSIS — I129 Hypertensive chronic kidney disease with stage 1 through stage 4 chronic kidney disease, or unspecified chronic kidney disease: Secondary | ICD-10-CM | POA: Diagnosis not present

## 2021-12-05 DIAGNOSIS — D649 Anemia, unspecified: Secondary | ICD-10-CM | POA: Diagnosis not present

## 2021-12-05 DIAGNOSIS — I951 Orthostatic hypotension: Secondary | ICD-10-CM | POA: Diagnosis not present

## 2021-12-08 ENCOUNTER — Other Ambulatory Visit: Payer: Self-pay | Admitting: Internal Medicine

## 2021-12-08 DIAGNOSIS — R634 Abnormal weight loss: Secondary | ICD-10-CM

## 2021-12-14 ENCOUNTER — Other Ambulatory Visit (HOSPITAL_COMMUNITY): Payer: Self-pay | Admitting: *Deleted

## 2021-12-16 ENCOUNTER — Encounter (HOSPITAL_COMMUNITY)
Admission: RE | Admit: 2021-12-16 | Discharge: 2021-12-16 | Disposition: A | Discharge status: Home / Self Care | Payer: Medicare Other | Source: Ambulatory Visit | Attending: Internal Medicine | Admitting: Internal Medicine

## 2021-12-16 DIAGNOSIS — D649 Anemia, unspecified: Secondary | ICD-10-CM | POA: Insufficient documentation

## 2021-12-16 MED ORDER — SODIUM CHLORIDE 0.9 % IV SOLN
510.0000 mg | INTRAVENOUS | Status: DC
  Administered 2021-12-16: 510 mg via INTRAVENOUS
  Filled 2021-12-16: qty 510

## 2021-12-20 DIAGNOSIS — I129 Hypertensive chronic kidney disease with stage 1 through stage 4 chronic kidney disease, or unspecified chronic kidney disease: Secondary | ICD-10-CM | POA: Diagnosis not present

## 2021-12-20 DIAGNOSIS — D649 Anemia, unspecified: Secondary | ICD-10-CM | POA: Diagnosis not present

## 2021-12-20 DIAGNOSIS — I951 Orthostatic hypotension: Secondary | ICD-10-CM | POA: Diagnosis not present

## 2021-12-20 DIAGNOSIS — E871 Hypo-osmolality and hyponatremia: Secondary | ICD-10-CM | POA: Diagnosis not present

## 2021-12-22 ENCOUNTER — Telehealth: Payer: Self-pay | Admitting: Hematology and Oncology

## 2021-12-22 NOTE — Telephone Encounter (Signed)
Scheduled appt per 9/28 referral. Pt is aware of appt date and time. Pt is aware to arrive 15 mins prior to appt time and to bring and updated insurance card. Pt is aware of appt location.

## 2021-12-23 ENCOUNTER — Encounter (HOSPITAL_COMMUNITY)
Admission: RE | Admit: 2021-12-23 | Discharge: 2021-12-23 | Disposition: A | Discharge status: Home / Self Care | Payer: Medicare Other | Source: Ambulatory Visit | Attending: Internal Medicine | Admitting: Internal Medicine

## 2021-12-23 DIAGNOSIS — D649 Anemia, unspecified: Secondary | ICD-10-CM | POA: Diagnosis not present

## 2021-12-23 MED ORDER — SODIUM CHLORIDE 0.9 % IV SOLN
510.0000 mg | INTRAVENOUS | Status: DC
  Administered 2021-12-23: 510 mg via INTRAVENOUS
  Filled 2021-12-23: qty 510

## 2022-01-02 ENCOUNTER — Ambulatory Visit
Admission: RE | Admit: 2022-01-02 | Discharge: 2022-01-02 | Disposition: A | Discharge status: Home / Self Care | Payer: Medicare Other | Source: Ambulatory Visit | Attending: Internal Medicine | Admitting: Internal Medicine

## 2022-01-02 DIAGNOSIS — D734 Cyst of spleen: Secondary | ICD-10-CM | POA: Diagnosis not present

## 2022-01-02 DIAGNOSIS — K8020 Calculus of gallbladder without cholecystitis without obstruction: Secondary | ICD-10-CM | POA: Diagnosis not present

## 2022-01-02 DIAGNOSIS — K5900 Constipation, unspecified: Secondary | ICD-10-CM | POA: Diagnosis not present

## 2022-01-02 DIAGNOSIS — R634 Abnormal weight loss: Secondary | ICD-10-CM

## 2022-01-02 MED ORDER — IOPAMIDOL (ISOVUE-300) INJECTION 61%
100.0000 mL | Freq: Once | INTRAVENOUS | Status: AC | PRN
  Administered 2022-01-02: 100 mL via INTRAVENOUS

## 2022-01-03 ENCOUNTER — Inpatient Hospital Stay: Payer: Medicare Other | Attending: Hematology and Oncology | Admitting: Hematology and Oncology

## 2022-01-03 ENCOUNTER — Telehealth: Payer: Self-pay

## 2022-01-03 ENCOUNTER — Other Ambulatory Visit: Payer: Self-pay

## 2022-01-03 ENCOUNTER — Inpatient Hospital Stay: Payer: Medicare Other

## 2022-01-03 ENCOUNTER — Encounter: Payer: Self-pay | Admitting: Hematology and Oncology

## 2022-01-03 VITALS — BP 129/48 | HR 69 | Temp 97.7°F | Resp 16 | Ht 69.0 in | Wt 168.2 lb

## 2022-01-03 DIAGNOSIS — E538 Deficiency of other specified B group vitamins: Secondary | ICD-10-CM | POA: Insufficient documentation

## 2022-01-03 DIAGNOSIS — Z7982 Long term (current) use of aspirin: Secondary | ICD-10-CM | POA: Insufficient documentation

## 2022-01-03 DIAGNOSIS — D649 Anemia, unspecified: Secondary | ICD-10-CM

## 2022-01-03 DIAGNOSIS — N189 Chronic kidney disease, unspecified: Secondary | ICD-10-CM | POA: Insufficient documentation

## 2022-01-03 DIAGNOSIS — J449 Chronic obstructive pulmonary disease, unspecified: Secondary | ICD-10-CM | POA: Diagnosis not present

## 2022-01-03 DIAGNOSIS — K5900 Constipation, unspecified: Secondary | ICD-10-CM | POA: Insufficient documentation

## 2022-01-03 DIAGNOSIS — Z79899 Other long term (current) drug therapy: Secondary | ICD-10-CM | POA: Insufficient documentation

## 2022-01-03 DIAGNOSIS — Z87891 Personal history of nicotine dependence: Secondary | ICD-10-CM | POA: Insufficient documentation

## 2022-01-03 LAB — IRON AND IRON BINDING CAPACITY (CC-WL,HP ONLY)
Iron: 19 ug/dL — ABNORMAL LOW (ref 45–182)
Saturation Ratios: 10 % — ABNORMAL LOW (ref 17.9–39.5)
TIBC: 200 ug/dL — ABNORMAL LOW (ref 250–450)
UIBC: 181 ug/dL (ref 117–376)

## 2022-01-03 LAB — COMPREHENSIVE METABOLIC PANEL
ALT: 15 U/L (ref 0–44)
AST: 16 U/L (ref 15–41)
Albumin: 3.2 g/dL — ABNORMAL LOW (ref 3.5–5.0)
Alkaline Phosphatase: 57 U/L (ref 38–126)
Anion gap: 6 (ref 5–15)
BUN: 14 mg/dL (ref 8–23)
CO2: 27 mmol/L (ref 22–32)
Calcium: 8.8 mg/dL — ABNORMAL LOW (ref 8.9–10.3)
Chloride: 103 mmol/L (ref 98–111)
Creatinine, Ser: 1.14 mg/dL (ref 0.61–1.24)
GFR, Estimated: >60 mL/min (ref >60)
Glucose, Bld: 89 mg/dL (ref 70–99)
Potassium: 3.6 mmol/L (ref 3.5–5.1)
Sodium: 136 mmol/L (ref 135–145)
Total Bilirubin: 0.5 mg/dL (ref 0.3–1.2)
Total Protein: 7.6 g/dL (ref 6.5–8.1)

## 2022-01-03 LAB — CBC WITH DIFFERENTIAL/PLATELET
Abs Immature Granulocytes: 0.1 10*3/uL — ABNORMAL HIGH (ref 0.00–0.07)
Basophils Absolute: 0 10*3/uL (ref 0.0–0.1)
Basophils Relative: 1 %
Eosinophils Absolute: 0.1 10*3/uL (ref 0.0–0.5)
Eosinophils Relative: 1 %
HCT: 26.1 % — ABNORMAL LOW (ref 39.0–52.0)
Hemoglobin: 8.3 g/dL — ABNORMAL LOW (ref 13.0–17.0)
Immature Granulocytes: 2 %
Lymphocytes Relative: 18 %
Lymphs Abs: 1 10*3/uL (ref 0.7–4.0)
MCH: 28.2 pg (ref 26.0–34.0)
MCHC: 31.8 g/dL (ref 30.0–36.0)
MCV: 88.8 fL (ref 80.0–100.0)
Monocytes Absolute: 0.7 10*3/uL (ref 0.1–1.0)
Monocytes Relative: 13 %
Neutro Abs: 3.8 10*3/uL (ref 1.7–7.7)
Neutrophils Relative %: 65 %
Platelets: 204 10*3/uL (ref 150–400)
RBC: 2.94 MIL/uL — ABNORMAL LOW (ref 4.22–5.81)
RDW: 16.3 % — ABNORMAL HIGH (ref 11.5–15.5)
WBC: 5.8 10*3/uL (ref 4.0–10.5)
nRBC: 0 % (ref 0.0–0.2)

## 2022-01-03 LAB — VITAMIN B12: Vitamin B-12: 178 pg/mL — ABNORMAL LOW (ref 180–914)

## 2022-01-03 LAB — RETICULOCYTES
Immature Retic Fract: 17.2 % — ABNORMAL HIGH (ref 2.3–15.9)
RBC.: 2.92 MIL/uL — ABNORMAL LOW (ref 4.22–5.81)
Retic Count, Absolute: 62.2 10*3/uL (ref 19.0–186.0)
Retic Ct Pct: 2.1 % (ref 0.4–3.1)

## 2022-01-03 LAB — TSH: TSH: 7.535 u[IU]/mL — ABNORMAL HIGH (ref 0.350–4.500)

## 2022-01-03 LAB — FERRITIN: Ferritin: 1208 ng/mL — ABNORMAL HIGH (ref 24–336)

## 2022-01-03 LAB — LACTATE DEHYDROGENASE: LDH: 201 U/L — ABNORMAL HIGH (ref 98–192)

## 2022-01-03 NOTE — Progress Notes (Signed)
[facility] CONSULT NOTE  Patient Care Team: Mitchell Neal, Mitchell Him, MD as PCP - General (Internal Medicine)  CHIEF COMPLAINTS/PURPOSE OF CONSULTATION:  Normocytic normochromic anemia  ASSESSMENT & PLAN:  Normocytic normochromic anemia This is a pleasant 81 year old male patient with worsening anemia, normocytic normochromic referred to hematology for additional recommendations.  Patient also complains of fatigue and some weight loss but he mentions that the weight loss could be intentional secondary to changing dietary habits.  He otherwise has noted some constipation , no evidence of hematochezia or melena. Physical examination today without any major concerns.  We have reviewed his labs which showed normocytic normochromic anemia, last ferritin scanned to [place] was in 800s hence no evidence of iron deficiency.  We have discussed about common causes of normocytic normochromic anemia including anemia of chronic disease, multiple myeloma, rarely combined nutritional deficiencies, thyroid issues, myelodysplasia and sometimes with GI bleed.  We have discussed about considering bone marrow aspiration and biopsy if we do not have a clear explanation from his lab as well as considering repeat colonoscopy.  He is agreeable to all the recommendations.  He will return to clinic in about 2 weeks to review labs and to discuss additional recommendations.  Thank you for consulting [place] the care of this patient.  Please not hesitate contact us with any additional questions or concerns.  Orders Placed This Encounter  Procedures   CBC with Differential/Platelet    Standing Status:   Standing    Number of Occurrences:   22    Standing Expiration Date:   01/04/2023   Comprehensive metabolic panel    Standing Status:   Standing    Number of Occurrences:   33    Standing Expiration Date:   01/04/2023   TSH    Standing Status:   Standing    Number of Occurrences:   22    Standing Expiration Date:    01/04/2023   Iron and Iron Binding Capacity ([facility] only)    Standing Status:   Future    Number of Occurrences:   1    Standing Expiration Date:   01/04/2023   Ferritin    Standing Status:   Future    Number of Occurrences:   1    Standing Expiration Date:   01/03/2023   Vitamin B12    Standing Status:   Future    Number of Occurrences:   1    Standing Expiration Date:   01/03/2023   Folate RBC    Standing Status:   Future    Number of Occurrences:   1    Standing Expiration Date:   01/04/2023   Lactate dehydrogenase    Standing Status:   Future    Number of Occurrences:   1    Standing Expiration Date:   01/04/2023   Reticulocytes    Standing Status:   Future    Number of Occurrences:   1    Standing Expiration Date:   01/04/2023   SPEP with reflex to IFE    Standing Status:   Future    Number of Occurrences:   1    Standing Expiration Date:   01/03/2023   Kappa/lambda light chains    Standing Status:   Future    Number of Occurrences:   1    Standing Expiration Date:   01/03/2023     HISTORY OF PRESENTING ILLNESS:  Mitchell Neal 81 y.o. male is here because of normocytic normochromic  anemia.  Mitchell Neal is an 81 year old male patient with past medical history significant for COPD, chronic kidney disease referred to hematology for evaluation of anemia.  He arrived with his daughter to the appointment.  He is good historian.  He tells me that he has started feeling tired over the last several months.  He also lost about 40 pounds of weight but most of this is intentional according to Neal given change in his dietary habits.,  He has noted some constipation and some small pellet like stool but no blood in stool or melena.  He had his last colonoscopy about 5 years ago and this showed some tubular adenomas.  No change in breathing.  No change in urinary habits.  He quit smoking several years ago, before that smoked a pack per day for about 50+ years.  He denies any heavy  alcohol use.  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   Hearing loss    no hearing aids   HYPERTENSION 10/07/2009   Kidney stones    passed stones, no surgery required    SURGICAL HISTORY: Past Surgical History:  Procedure Laterality Date   COLONOSCOPY     polyps   POLYPECTOMY     neck -    TONSILLECTOMY      SOCIAL HISTORY: Social History   Socioeconomic History   Marital status: Widowed    Spouse name: Not on file   Number of children: Not on file   Years of education: Not on file   Highest education level: Not on file  Occupational History   Not on file  Tobacco Use   Smoking status: Former    Packs/day: 0.50    Years: 25.00    Total pack years: 12.50    Types: Cigarettes    Quit date: 08/26/2010    Years since quitting: 11.3   Smokeless tobacco: Never  Vaping Use   Vaping Use: Never used  Substance and Sexual Activity   Alcohol use: No   Drug use: No   Sexual activity: Not on file  Other Topics Concern   Not on file  Social History Narrative   Not on file   Social Determinants of Health   Financial Resource Strain: Not on file  Food Insecurity: Not on file  Transportation Needs: Not on file  Physical Activity: Not on file  Stress: Not on file  Social Connections: Not on file  Intimate Partner Violence: Not on file    FAMILY HISTORY: Family History  Problem Relation Age of Onset   Hypertension Unknown    Colon cancer Neg Hx    Colon polyps Neg Hx    Esophageal cancer Neg Hx    Rectal cancer Neg Hx    Stomach cancer Neg Hx     ALLERGIES:  is allergic to prednisolone.  MEDICATIONS:  Current Outpatient Medications   Medication Sig Dispense Refill   ADVAIR DISKUS 250-50 MCG/DOSE AEPB INHALE ONE DOSE BY MOUTH TWICE DAILY 60 each 0   albuterol (PROVENTIL HFA;VENTOLIN HFA) 108 (90 BASE) MCG/ACT inhaler One puffs 3 times daily 1  Inhaler 2   amLODipine (NORVASC) 10 MG tablet take 1 tablet by mouth daily 100 tablet 3   aspirin 81 MG EC tablet Take 81 mg by mouth daily.       FLUoxetine (PROZAC) 20 MG capsule Take 20 mg by mouth daily.     labetalol (NORMODYNE) 300 MG tablet take 1 tablet by mouth twice a day 180 tablet 3   losartan (COZAAR) 50 MG tablet Take 1 tablet (50 mg total) by mouth 2 (two) times daily. 60 tablet 3   Current Facility-Administered Medications  Medication Dose Route Frequency Provider Last Rate Last Admin   0.9 %  sodium chloride infusion  500 mL Intravenous Continuous Nandigam, Kavitha V, MD         PHYSICAL EXAMINATION: ECOG PERFORMANCE STATUS: 0 - Asymptomatic  Vitals:   01/03/22 1046  BP: (!) 129/48  Pulse: 69  Resp: 16  Temp: 97.7 F (36.5 C)  SpO2: 100%   Filed Weights   01/03/22 1046  Weight: 168 lb 3.2 oz (76.3 kg)   Physical Exam Constitutional:      Appearance: Normal appearance.  Cardiovascular:     Rate and Rhythm: Normal rate and regular rhythm.     Pulses: Normal pulses.     Heart sounds: Normal heart sounds.  Pulmonary:     Effort: Pulmonary effort is normal.     Breath sounds: Normal breath sounds.  Abdominal:     General: Abdomen is flat.     Palpations: Abdomen is soft.     Comments: No overt splenomegaly  Musculoskeletal:        General: Swelling (1+ lower extremity swelling, varicosities noted.) present.     Cervical back: Normal range of motion and neck supple. No rigidity.  Lymphadenopathy:     Cervical: No cervical adenopathy.  Skin:    General: Skin is warm and dry.  Neurological:     General: No focal deficit present.     Mental Status: He is alert.      LABORATORY DATA:  I have reviewed the data as listed Lab Results   Component Value Date   WBC 5.8 01/03/2022   HGB 8.3 (L) 01/03/2022   HCT 26.1 (L) 01/03/2022   MCV 88.8 01/03/2022   PLT 204 01/03/2022     Chemistry      Component Value Date/Time   NA 136 01/03/2022 1147   K 3.6 01/03/2022 1147   CL 103 01/03/2022 1147   CO2 27 01/03/2022 1147   BUN 14 01/03/2022 1147   CREATININE 1.14 01/03/2022 1147      Component Value Date/Time   CALCIUM 8.8 (L) 01/03/2022 1147   ALKPHOS 57 01/03/2022 1147   AST 16 01/03/2022 1147   ALT 15 01/03/2022 1147   BILITOT 0.5 01/03/2022 1147       RADIOGRAPHIC STUDIES: I have personally reviewed the radiological images as listed and agreed with the findings in the report. CT ABDOMEN PELVIS W CONTRAST  Result Date: 01/03/2022 CLINICAL DATA:  40 pound weight loss over 3 months.  Constipation. EXAM: CT ABDOMEN AND PELVIS WITH CONTRAST TECHNIQUE: Multidetector CT imaging of the abdomen and pelvis was performed using the standard protocol following bolus administration of intravenous contrast. RADIATION DOSE REDUCTION: This exam was performed according to the departmental dose-optimization program which includes automated exposure control, adjustment of the mA and/or kV according to patient size and/or use of iterative reconstruction technique. CONTRAST:  123m ISOVUE-300 IOPAMIDOL (ISOVUE-300) INJECTION 61% COMPARISON:  October 14, 2009  FINDINGS: Lower chest: Small right pleural effusion and minimal left pleural effusion are noted. Minimal atelectasis of the anterior right lung base identified. There is a small to moderate pericardial effusion. The heart size is normal. Hepatobiliary: There is a 1.2 cm low-density mass in the liver near the falciform ligament. Mild diffuse low density of the liver is identified. The liver is otherwise normal. Gallstones are noted in the gallbladder. There is no inflammation around the gallbladder. The biliary tree is normal. Pancreas: Unremarkable. No pancreatic ductal dilatation or  surrounding inflammatory changes. 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 Adrenals/Urinary Tract: Adrenal glands are unremarkable. Kidneys are normal, without renal calculi, focal lesion, or hydronephrosis. Bladder is unremarkable. Stomach/Bowel: Stomach is within normal limits. The appendix is not seen but no inflammation is noted around cecum. Mild bowel content is identified in the colon. No evidence of bowel wall thickening, distention, or inflammatory changes. Vascular/Lymphatic: Aortic atherosclerosis. No enlarged abdominal or pelvic lymph nodes. Reproductive: Prostate is unremarkable. Other: Small bilateral inguinal herniation mesenteric fat noted. Musculoskeletal: Degenerative joint changes of the spine are noted. IMPRESSION: 1. No acute abnormality identified in the abdomen and pelvis. 2. 1.2 cm low-density mass in the liver near the falciform ligament. This is nonspecific. Consider further evaluation with ultrasound of liver. 3. Cholelithiasis. 4. 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 5. Small right pleural effusion and minimal left pleural effusion. 6. Small to moderate pericardial effusion. 7. Aortic atherosclerosis. Aortic Atherosclerosis (ICD10-I70.0). Electronically Signed   By: Abelardo Diesel M.D.   On: 01/03/2022 12:25    All questions were answered. The patient knows to call the clinic with any problems, questions or concerns. I spent 45 minutes in the care of this patient including H and P, review of records, counseling and coordination of care.     Benay Pike, MD 01/03/2022 4:06 PM

## 2022-01-03 NOTE — Telephone Encounter (Signed)
Called pt and LVM for call back to give instructions per MD.

## 2022-01-03 NOTE — Assessment & Plan Note (Addendum)
This is a pleasant 81 year old male patient with worsening anemia, normocytic normochromic referred to hematology for additional recommendations.  Patient also complains of fatigue and some weight loss but he mentions that the weight loss could be intentional secondary to changing dietary habits.  He otherwise has noted some constipation , no evidence of hematochezia or melena. Physical examination today without any major concerns.  We have reviewed his labs which showed normocytic normochromic anemia, last ferritin scanned to [place] was in 800s hence no evidence of iron deficiency.  We have discussed about common causes of normocytic normochromic anemia including anemia of chronic disease, multiple myeloma, rarely combined nutritional deficiencies, thyroid issues, myelodysplasia and sometimes with GI bleed.  We have discussed about considering bone marrow aspiration and biopsy if we do not have a clear explanation from his lab as well as considering repeat colonoscopy.  He is agreeable to all the recommendations.  He will return to clinic in about 2 weeks to review labs and to discuss additional recommendations.  Thank you for consulting [place] the care of this patient.  Please not hesitate contact us with any additional questions or concerns.

## 2022-01-03 NOTE — Telephone Encounter (Signed)
-----   Message from Benay Pike, MD sent at 01/03/2022  4:08 PM EDT ----- Mickel Baas Can you asked the patient to start taking B12 supplementation 1000 mcg daily.  Can he also call his gastroenterologist from [facility] and schedule a follow-up for possible repeat colonoscopy.

## 2022-01-04 LAB — FOLATE RBC
Folate, Hemolysate: 255 ng/mL
Folate, RBC: 1012 ng/mL (ref >498)
Hematocrit: 25.2 % — ABNORMAL LOW (ref 37.5–51.0)

## 2022-01-05 LAB — KAPPA/LAMBDA LIGHT CHAINS
Kappa free light chain: 60.3 mg/L — ABNORMAL HIGH (ref 3.3–19.4)
Kappa, lambda light chain ratio: 2.25 — ABNORMAL HIGH (ref 0.26–1.65)
Lambda free light chains: 26.8 mg/L — ABNORMAL HIGH (ref 5.7–26.3)

## 2022-01-06 ENCOUNTER — Other Ambulatory Visit: Payer: Self-pay | Admitting: Internal Medicine

## 2022-01-06 DIAGNOSIS — R634 Abnormal weight loss: Secondary | ICD-10-CM

## 2022-01-06 DIAGNOSIS — R160 Hepatomegaly, not elsewhere classified: Secondary | ICD-10-CM

## 2022-01-06 DIAGNOSIS — K760 Fatty (change of) liver, not elsewhere classified: Secondary | ICD-10-CM

## 2022-01-06 LAB — PROTEIN ELECTROPHORESIS, SERUM, WITH REFLEX
A/G Ratio: 0.7 (ref 0.7–1.7)
Albumin ELP: 2.8 g/dL — ABNORMAL LOW (ref 2.9–4.4)
Alpha-1-Globulin: 0.4 g/dL (ref 0.0–0.4)
Alpha-2-Globulin: 0.8 g/dL (ref 0.4–1.0)
Beta Globulin: 0.8 g/dL (ref 0.7–1.3)
Gamma Globulin: 1.8 g/dL (ref 0.4–1.8)
Globulin, Total: 3.8 g/dL (ref 2.2–3.9)
Total Protein ELP: 6.6 g/dL (ref 6.0–8.5)

## 2022-01-10 ENCOUNTER — Ambulatory Visit: Payer: Medicare Other | Admitting: Internal Medicine

## 2022-01-10 ENCOUNTER — Encounter: Payer: Self-pay | Admitting: Internal Medicine

## 2022-01-10 VITALS — BP 120/58 | HR 62 | Temp 97.2°F | Resp 16 | Ht 69.0 in | Wt 165.0 lb

## 2022-01-10 DIAGNOSIS — I10 Essential (primary) hypertension: Secondary | ICD-10-CM

## 2022-01-10 DIAGNOSIS — I3139 Other pericardial effusion (noninflammatory): Secondary | ICD-10-CM | POA: Diagnosis not present

## 2022-01-10 DIAGNOSIS — D509 Iron deficiency anemia, unspecified: Secondary | ICD-10-CM | POA: Diagnosis not present

## 2022-01-10 NOTE — Progress Notes (Signed)
Primary Physician/Referring:  Haywood Pao, MD  Patient ID: Mitchell Neal, male    DOB: [birth year], 81 y.o.   MRN: [id number]  Chief Complaint  Patient presents with   pericardinal effusion   Hypertension   New Patient (Initial Visit)   HPI:    Mitchell Neal  is a 81 y.o. male with past medical history significant for hypertension, iron deficiency, and newly diagnosed pericardial effusion seen on CT.  Patient has been having significant weight loss, low red blood cell counts, and shortness of breath despite iron infusions.  Patient is seeing a hematologist however no formal diagnosis has been made yet.  Patient was recommended to get bone marrow biopsy and or colonoscopy to determine the source of his iron deficiency anemia.  His pericardial effusion could be related to cancer elsewhere in the body.  Patient has not had an echocardiogram in the past.  He is quite sensitive to his blood pressure medications lately.  His primary doctor did decrease one of his medications to once a day rather than twice a day.  Patient still very lightheaded and orthostatic when he changes position.  We will stop his amlodipine, patient is agreeable to this.  He denies chest pain, palpitations, diaphoresis, syncope, edema, orthopnea, claudication.  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   Hearing loss    no hearing aids   HYPERTENSION 10/07/2009   Kidney stones    passed stones, no surgery required   Past Surgical History:  Procedure Laterality Date   COLONOSCOPY     polyps   POLYPECTOMY     neck -    TONSILLECTOMY     Family History  Problem Relation Age of Onset   Heart disease Sister    Hypertension Other    Colon cancer Neg Hx    Colon polyps Neg Hx    Esophageal cancer Neg Hx     Rectal cancer Neg Hx    Stomach cancer Neg Hx     Social History   Tobacco Use   Smoking status: Former    Packs/day: 0.50    Years: 25.00    Total pack years: 12.50    Types: Cigarettes    Quit date: 08/26/2010    Years since quitting: 11.3   Smokeless tobacco: Never  Substance Use Topics   Alcohol use: No   Marital Status: Widowed  ROS  ROS Objective  Blood pressure (!) 120/58, pulse 62, temperature (!) 97.2 F (36.2 C), temperature source Temporal, resp. rate 16, height '5\' 9"'  (1.753 m), weight 165 lb (74.8 kg), SpO2 99 %. Body mass index is 24.37 kg/m.     01/10/2022    8:34 AM 01/10/2022    8:24 AM 01/03/2022   10:46 AM  Vitals with BMI  Height  '5\' 9"'  '5\' 9"'   Weight  165 lbs 168 lbs 3 oz  BMI  64.38 38.18  Systolic 403 94 754  Diastolic 58 49 48  Pulse 62 73 69     Physical Exam  Medications and allergies   Allergies  Allergen Reactions   Prednisolone      Medication list after today's encounter   Current Outpatient Medications:    ADVAIR DISKUS 250-50 MCG/DOSE AEPB,  INHALE ONE DOSE BY MOUTH TWICE DAILY, Disp: 60 each, Rfl: 0   albuterol (PROVENTIL HFA;VENTOLIN HFA) 108 (90 BASE) MCG/ACT inhaler, One puffs 3 times daily, Disp: 1 Inhaler, Rfl: 2   aspirin 81 MG EC tablet, Take 81 mg by mouth daily.  , Disp: , Rfl:    FLUoxetine (PROZAC) 20 MG capsule, Take 20 mg by mouth daily., Disp: , Rfl:    furosemide (LASIX) 20 MG tablet, Take 1 tablet by mouth daily., Disp: , Rfl:    labetalol (NORMODYNE) 300 MG tablet, take 1 tablet by mouth twice a day, Disp: 180 tablet, Rfl: 3   losartan (COZAAR) 50 MG tablet, Take 1 tablet (50 mg total) by mouth 2 (two) times daily. (Patient taking differently: Take 50 mg by mouth daily.), Disp: 60 tablet, Rfl: 3   potassium chloride (KLOR-CON) 10 MEQ tablet, Take 10 mEq by mouth daily., Disp: , Rfl:    valsartan (DIOVAN) 160 MG tablet, Take 1 tablet by mouth daily., Disp: , Rfl:   Current Facility-Administered Medications:    0.9  %  sodium chloride infusion, 500 mL, Intravenous, Continuous, Nandigam, Kavitha V, MD  Laboratory examination:   Lab Results  Component Value Date   NA 136 01/03/2022   K 3.6 01/03/2022   CO2 27 01/03/2022   GLUCOSE 89 01/03/2022   BUN 14 01/03/2022   CREATININE 1.14 01/03/2022   CALCIUM 8.8 (L) 01/03/2022   GFRNONAA >60 01/03/2022       Latest Ref Rng & Units 01/03/2022   11:47 AM 06/24/2011    8:15 AM 06/24/2011   12:00 AM  CMP  Glucose 70 - 99 mg/dL 89   95   BUN 8 - 23 mg/dL 14   23   Creatinine 0.61 - 1.24 mg/dL 1.14  1.48  1.44   Sodium 135 - 145 mmol/L 136   139   Potassium 3.5 - 5.1 mmol/L 3.6   4.2   Chloride 98 - 111 mmol/L 103   103   CO2 22 - 32 mmol/L 27   24   Calcium 8.9 - 10.3 mg/dL 8.8   9.4   Total Protein 6.5 - 8.1 g/dL 7.6     Total Bilirubin 0.3 - 1.2 mg/dL 0.5     Alkaline Phos 38 - 126 U/L 57     AST 15 - 41 U/L 16     ALT 0 - 44 U/L 15         Latest Ref Rng & Units 01/03/2022   11:48 AM 01/03/2022   11:47 AM 06/24/2011    8:15 AM  CBC  WBC 4.0 - 10.5 K/uL  5.8  15.3   Hemoglobin 13.0 - 17.0 g/dL  8.3  14.4   Hematocrit 37.5 - 51.0 % 25.2  26.1  42.6   Platelets 150 - 400 K/uL  204  242     Lipid Panel No results for input(s): "CHOL", "TRIG", "LDLCALC", "VLDL", "HDL", "CHOLHDL", "LDLDIRECT" in the last 8760 hours.  HEMOGLOBIN A1C No results found for: "HGBA1C", "MPG" TSH Recent Labs    01/03/22 1148  TSH 7.535*    External labs:     Radiology:    Cardiac Studies:   No results found for this or any previous visit from the past 1095 days.     No results found for this or any previous visit from the past 1095 days.     EKG:   01/10/2022 normal sinus rhythm, heart rate 60, cannot rule out junctional  escape rhythm given very low amplitude P waves.  QRS complexes unchanged from prior EKG in 2015  Assessment     ICD-10-CM   1. Essential hypertension  I10 EKG 12-Lead    PCV ECHOCARDIOGRAM COMPLETE    2. Pericardial  effusion  I31.39 PCV ECHOCARDIOGRAM COMPLETE    3. Iron deficiency anemia, unspecified iron deficiency anemia type  D50.9        Orders Placed This Encounter  Procedures   EKG 12-Lead   PCV ECHOCARDIOGRAM COMPLETE    Standing Status:   Future    Standing Expiration Date:   01/11/2023    No orders of the defined types were placed in this encounter.   Medications Discontinued During This Encounter  Medication Reason   amLODipine (NORVASC) 10 MG tablet      Recommendations:   ARIYON MITTLEMAN is a 81 y.o. male with hypertension  Essential hypertension Discontinue amlodipine Continue other medications Encourage low-sodium diet, less than 2000 mg daily.   Pericardial effusion Will obtain echocardiogram   Iron deficiency anemia, unspecified iron deficiency anemia type Patient following up with hematologist next week Follow-up in 6 weeks or sooner if needed  Total time spent with patient was 45 minutes and greater than 50% of that time was spent in counseling and coordination care with the patient regarding complex decision making and discussion as state above.      Floydene Flock, DO, [facility]  01/10/2022, 10:24 AM Office: [phone] Pager: [phone]

## 2022-01-11 ENCOUNTER — Ambulatory Visit
Admission: RE | Admit: 2022-01-11 | Discharge: 2022-01-11 | Disposition: A | Discharge status: Home / Self Care | Payer: Medicare Other | Source: Ambulatory Visit | Attending: Internal Medicine | Admitting: Internal Medicine

## 2022-01-11 DIAGNOSIS — K8020 Calculus of gallbladder without cholecystitis without obstruction: Secondary | ICD-10-CM | POA: Diagnosis not present

## 2022-01-11 DIAGNOSIS — R160 Hepatomegaly, not elsewhere classified: Secondary | ICD-10-CM

## 2022-01-11 DIAGNOSIS — R634 Abnormal weight loss: Secondary | ICD-10-CM | POA: Diagnosis not present

## 2022-01-11 DIAGNOSIS — D734 Cyst of spleen: Secondary | ICD-10-CM | POA: Diagnosis not present

## 2022-01-11 DIAGNOSIS — K760 Fatty (change of) liver, not elsewhere classified: Secondary | ICD-10-CM

## 2022-01-18 ENCOUNTER — Other Ambulatory Visit: Payer: Self-pay

## 2022-01-18 ENCOUNTER — Inpatient Hospital Stay: Payer: Medicare Other

## 2022-01-18 ENCOUNTER — Inpatient Hospital Stay: Payer: Medicare Other | Admitting: Hematology and Oncology

## 2022-01-18 ENCOUNTER — Encounter: Payer: Self-pay | Admitting: Hematology and Oncology

## 2022-01-18 VITALS — BP 105/52 | HR 66 | Temp 97.8°F | Resp 16 | Ht 69.0 in | Wt 162.1 lb

## 2022-01-18 DIAGNOSIS — N189 Chronic kidney disease, unspecified: Secondary | ICD-10-CM | POA: Diagnosis not present

## 2022-01-18 DIAGNOSIS — D649 Anemia, unspecified: Secondary | ICD-10-CM | POA: Diagnosis not present

## 2022-01-18 DIAGNOSIS — Z87891 Personal history of nicotine dependence: Secondary | ICD-10-CM | POA: Diagnosis not present

## 2022-01-18 DIAGNOSIS — Z7982 Long term (current) use of aspirin: Secondary | ICD-10-CM | POA: Diagnosis not present

## 2022-01-18 DIAGNOSIS — Z79899 Other long term (current) drug therapy: Secondary | ICD-10-CM | POA: Diagnosis not present

## 2022-01-18 DIAGNOSIS — E538 Deficiency of other specified B group vitamins: Secondary | ICD-10-CM | POA: Diagnosis not present

## 2022-01-18 DIAGNOSIS — J449 Chronic obstructive pulmonary disease, unspecified: Secondary | ICD-10-CM | POA: Diagnosis not present

## 2022-01-18 DIAGNOSIS — K5900 Constipation, unspecified: Secondary | ICD-10-CM | POA: Diagnosis not present

## 2022-01-18 LAB — COMPREHENSIVE METABOLIC PANEL
ALT: 15 U/L (ref 0–44)
AST: 17 U/L (ref 15–41)
Albumin: 2.8 g/dL — ABNORMAL LOW (ref 3.5–5.0)
Alkaline Phosphatase: 63 U/L (ref 38–126)
Anion gap: 9 (ref 5–15)
BUN: 24 mg/dL — ABNORMAL HIGH (ref 8–23)
CO2: 24 mmol/L (ref 22–32)
Calcium: 9.1 mg/dL (ref 8.9–10.3)
Chloride: 104 mmol/L (ref 98–111)
Creatinine, Ser: 1.37 mg/dL — ABNORMAL HIGH (ref 0.61–1.24)
GFR, Estimated: 52 mL/min — ABNORMAL LOW (ref >60)
Glucose, Bld: 92 mg/dL (ref 70–99)
Potassium: 4.5 mmol/L (ref 3.5–5.1)
Sodium: 137 mmol/L (ref 135–145)
Total Bilirubin: 0.7 mg/dL (ref 0.3–1.2)
Total Protein: 7.8 g/dL (ref 6.5–8.1)

## 2022-01-18 LAB — CBC WITH DIFFERENTIAL/PLATELET
Abs Immature Granulocytes: 0.08 10*3/uL — ABNORMAL HIGH (ref 0.00–0.07)
Basophils Absolute: 0.1 10*3/uL (ref 0.0–0.1)
Basophils Relative: 1 %
Eosinophils Absolute: 0.1 10*3/uL (ref 0.0–0.5)
Eosinophils Relative: 1 %
HCT: 27.4 % — ABNORMAL LOW (ref 39.0–52.0)
Hemoglobin: 8.7 g/dL — ABNORMAL LOW (ref 13.0–17.0)
Immature Granulocytes: 1 %
Lymphocytes Relative: 17 %
Lymphs Abs: 1 10*3/uL (ref 0.7–4.0)
MCH: 28.9 pg (ref 26.0–34.0)
MCHC: 31.8 g/dL (ref 30.0–36.0)
MCV: 91 fL (ref 80.0–100.0)
Monocytes Absolute: 0.8 10*3/uL (ref 0.1–1.0)
Monocytes Relative: 13 %
Neutro Abs: 3.9 10*3/uL (ref 1.7–7.7)
Neutrophils Relative %: 67 %
Platelets: 212 10*3/uL (ref 150–400)
RBC: 3.01 MIL/uL — ABNORMAL LOW (ref 4.22–5.81)
RDW: 15.9 % — ABNORMAL HIGH (ref 11.5–15.5)
WBC: 5.9 10*3/uL (ref 4.0–10.5)
nRBC: 0 % (ref 0.0–0.2)

## 2022-01-18 LAB — TSH: TSH: 7.147 u[IU]/mL — ABNORMAL HIGH (ref 0.350–4.500)

## 2022-01-18 NOTE — Progress Notes (Signed)
[place] CONSULT NOTE  Patient Care Team: Tisovec, [name], MD as PCP - General (Internal Medicine)  CHIEF COMPLAINTS/PURPOSE OF CONSULTATION:  Normocytic normochromic anemia  ASSESSMENT & PLAN:  Normocytic normochromic anemia This is a pleasant 81 year old male patient with worsening anemia, normocytic normochromic referred to hematology for additional recommendations.  Patient also complains of fatigue and some weight loss but he mentions that the weight loss could be intentional secondary to changing dietary habits.   He is here to review labs. No evidence of iron deficiency.  B12 deficiency noted.  We left him a message to start B12 supplementation and also to reach out to [facility] gastroenterology for consideration of another colonoscopy.  He tells me that he did not really get this message.  He initially got a little upset that someone might have dropped the ball but I explained to him that we left a message on so-and-so day at so-and-so time and after that he calmed down. No clear evidence of hemolysis although LDH is mildly elevated.  SPEP normal, kappa lambda ratio once again mildly elevated.  Ferritin very elevated. We have discussed about trying B12 for couple weeks, repeating CBC today and if he continues to have downtrending hemoglobin, will arrange for a bone marrow aspiration and biopsy immediately.  We have discussed once again that the working diagnosis is B12 deficiency versus myelodysplasia at this time.  He expressed understanding of the recommendations.  Daughter had a few questions.  We have discussed that the intent of treatment for myelodysplasia is palliative along with supportive care, transfusion etc.  Orders Placed This Encounter  Procedures   CBC with Differential/Platelet    Standing Status:   Standing    Number of Occurrences:   22    Standing Expiration Date:   01/19/2023     HISTORY OF PRESENTING ILLNESS:  Mitchell Neal 81 y.o. male is  here because of normocytic normochromic anemia.  Mitchell Neal is an 81 year old male patient with past medical history significant for COPD, chronic kidney disease referred to hematology for evaluation of anemia.  He arrived with his daughter to the appointment.  Since his last visit, he continues to feel tired. He lost another 2-3 lbs in the past week. He tells me that he didn't really get the message to start B 12 supplementation. He says his appetite is ok, but he doesn't eat much.  No bleeding complaints.  Rest of the pertinent 10 point ROS reviewed and neg.  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   Hearing loss    no hearing aids   HYPERTENSION 10/07/2009   Kidney stones    passed stones, no surgery required    SURGICAL HISTORY: Past Surgical History:  Procedure Laterality Date   COLONOSCOPY     polyps   POLYPECTOMY     neck -    TONSILLECTOMY      SOCIAL HISTORY: Social History   Socioeconomic History   Marital status: Widowed    Spouse name: Not on file   Number of children: 2   Years of education: Not on file   Highest education level: Not on file  Occupational History   Not on file  Tobacco Use  Smoking status: Former    Packs/day: 0.50    Years: 25.00    Total pack years: 12.50    Types: Cigarettes    Quit date: 08/26/2010    Years since quitting: 11.4   Smokeless tobacco: Never  Vaping Use   Vaping Use: Never used  Substance and Sexual Activity   Alcohol use: No   Drug use: No   Sexual activity: Not on file  Other Topics Concern   Not on file  Social History Narrative   Not on file   Social Determinants of Health   Financial Resource Strain: Not on file  Food Insecurity: Not on file  Transportation Needs: Not on file   Physical Activity: Not on file  Stress: Not on file  Social Connections: Not on file  Intimate Partner Violence: Not on file    FAMILY HISTORY: Family History  Problem Relation Age of Onset   Heart disease Sister    Hypertension Other    Colon cancer Neg Hx    Colon polyps Neg Hx    Esophageal cancer Neg Hx    Rectal cancer Neg Hx    Stomach cancer Neg Hx     ALLERGIES:  is allergic to prednisolone.  MEDICATIONS:  Current Outpatient Medications  Medication Sig Dispense Refill   ADVAIR DISKUS 250-50 MCG/DOSE AEPB INHALE ONE DOSE BY MOUTH TWICE DAILY 60 each 0   albuterol (PROVENTIL HFA;VENTOLIN HFA) 108 (90 BASE) MCG/ACT inhaler One puffs 3 times daily 1 Inhaler 2   aspirin 81 MG EC tablet Take 81 mg by mouth daily.       FLUoxetine (PROZAC) 20 MG capsule Take 20 mg by mouth daily.     furosemide (LASIX) 20 MG tablet Take 1 tablet by mouth daily.     labetalol (NORMODYNE) 300 MG tablet take 1 tablet by mouth twice a day 180 tablet 3   losartan (COZAAR) 50 MG tablet Take 1 tablet (50 mg total) by mouth 2 (two) times daily. (Patient taking differently: Take 50 mg by mouth daily.) 60 tablet 3   potassium chloride (KLOR-CON) 10 MEQ tablet Take 10 mEq by mouth daily.     valsartan (DIOVAN) 160 MG tablet Take 1 tablet by mouth daily.     Current Facility-Administered Medications  Medication Dose Route Frequency Provider Last Rate Last Admin   0.9 %  sodium chloride infusion  500 mL Intravenous Continuous Nandigam, Kavitha V, MD         PHYSICAL EXAMINATION: ECOG PERFORMANCE STATUS: 0 - Asymptomatic  Vitals:   01/18/22 1342  BP: (!) 105/52  Pulse: 66  Resp: 16  Temp: 97.8 F (36.6 C)  SpO2: 100%   Filed Weights   01/18/22 1342  Weight: 162 lb 1.6 oz (73.5 kg)   PE deferred in lieu of counseling.  LABORATORY DATA:  I have reviewed the data as listed Lab Results  Component Value Date   WBC 5.8 01/03/2022   HGB 8.3 (L) 01/03/2022   HCT 25.2 (L) 01/03/2022   MCV  88.8 01/03/2022   PLT 204 01/03/2022     Chemistry      Component Value Date/Time   NA 136 01/03/2022 1147   K 3.6 01/03/2022 1147   CL 103 01/03/2022 1147   CO2 27 01/03/2022 1147   BUN 14 01/03/2022 1147   CREATININE 1.14 01/03/2022 1147      Component Value Date/Time   CALCIUM 8.8 (L) 01/03/2022 1147   ALKPHOS 57 01/03/2022 1147   AST  16 01/03/2022 1147   ALT 15 01/03/2022 1147   BILITOT 0.5 01/03/2022 1147       RADIOGRAPHIC STUDIES: I have personally reviewed the radiological images as listed and agreed with the findings in the report. US Abdomen Complete  Result Date: 01/11/2022 CLINICAL DATA:  Weight loss, liver lesion seen in CT EXAM: ABDOMEN ULTRASOUND COMPLETE COMPARISON:  CT done on 01/02/2022 FINDINGS: Gallbladder: Gallbladder is filled with stones and sludge. Technologist did not observe any tenderness over the gallbladder. There is no fluid around the gallbladder. Common bile duct: Diameter: 3.8 mm Liver: There is slightly inhomogeneous increased echogenicity in liver. There is 1.2 x 1.4 cm hyperechoic focus in the liver close to the Port-A bodies, possibly hemangioma. Similar lesion was seen in the previous CT. Technologist observed other hyperechoic foci which were not distinctly seen in previous CT, possibly fatty infiltration. Portal vein is patent on color Doppler imaging with normal direction of blood flow towards the liver. IVC: Limited evaluation Pancreas: Limited evaluation Spleen: Spleen measures 15.2 cm in maximum diameter. There is a 4.3 cm complex cyst in spleen. Right Kidney: Length: 10.6 cm. Echogenicity within normal limits. No mass or hydronephrosis visualized. Left Kidney: Length: 11.4 cm. Echogenicity within normal limits. No mass or hydronephrosis visualized. Abdominal aorta: Mostly obscured by bowel gas. Other findings: None. IMPRESSION: Technically difficult study due to bowel gas. Fatty liver. 1.4 cm hyperechoic focus in liver suggesting hemangioma.  Gallbladder stones.  There are no signs of acute cholecystitis. There is 4.3 cm complex cyst in the spleen. Electronically Signed   By: Elmer Picker M.D.   On: 01/11/2022 12:17   CT ABDOMEN PELVIS W CONTRAST  Result Date: 01/03/2022 CLINICAL DATA:  40 pound weight loss over 3 months.  Constipation. EXAM: CT ABDOMEN AND PELVIS WITH CONTRAST TECHNIQUE: Multidetector CT imaging of the abdomen and pelvis was performed using the standard protocol following bolus administration of intravenous contrast. RADIATION DOSE REDUCTION: This exam was performed according to the departmental dose-optimization program which includes automated exposure control, adjustment of the mA and/or kV according to patient size and/or use of iterative reconstruction technique. CONTRAST:  1103mL ISOVUE-300 IOPAMIDOL (ISOVUE-300) INJECTION 61% COMPARISON:  October 14, 2009 FINDINGS: Lower chest: Small right pleural effusion and minimal left pleural effusion are noted. Minimal atelectasis of the anterior right lung base identified. There is a small to moderate pericardial effusion. The heart size is normal. Hepatobiliary: There is a 1.2 cm low-density mass in the liver near the falciform ligament. Mild diffuse low density of the liver is identified. The liver is otherwise normal. Gallstones are noted in the gallbladder. There is no inflammation around the gallbladder. The biliary tree is normal. Pancreas: Unremarkable. No pancreatic ductal dilatation or surrounding inflammatory changes. 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 Adrenals/Urinary Tract: Adrenal glands are unremarkable. Kidneys are normal, without renal calculi, focal lesion, or hydronephrosis. Bladder is unremarkable. Stomach/Bowel: Stomach is within normal limits. The appendix is not seen but no inflammation is noted around cecum. Mild bowel content is identified in the colon. No evidence of bowel wall thickening, distention, or inflammatory  changes. Vascular/Lymphatic: Aortic atherosclerosis. No enlarged abdominal or pelvic lymph nodes. Reproductive: Prostate is unremarkable. Other: Small bilateral inguinal herniation mesenteric fat noted. Musculoskeletal: Degenerative joint changes of the spine are noted. IMPRESSION: 1. No acute abnormality identified in the abdomen and pelvis. 2. 1.2 cm low-density mass in the liver near the falciform ligament. This is nonspecific. Consider further evaluation with ultrasound of  liver. 3. Cholelithiasis. 4. 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 5. Small right pleural effusion and minimal left pleural effusion. 6. Small to moderate pericardial effusion. 7. Aortic atherosclerosis. Aortic Atherosclerosis (ICD10-I70.0). Electronically Signed   By: Abelardo Diesel M.D.   On: 01/03/2022 12:25    All questions were answered. The patient knows to call the clinic with any problems, questions or concerns. I spent 30 minutes in the care of this patient including H and P, review of records, counseling and coordination of care.     Benay Pike, MD 01/18/2022 2:20 PM

## 2022-01-18 NOTE — Assessment & Plan Note (Signed)
This is a pleasant 81 year old male patient with worsening anemia, normocytic normochromic referred to hematology for additional recommendations.  Patient also complains of fatigue and some weight loss but he mentions that the weight loss could be intentional secondary to changing dietary habits.   He is here to review labs. No evidence of iron deficiency.  B12 deficiency noted.  We left him a message to start B12 supplementation and also to reach out to [facility] gastroenterology for consideration of another colonoscopy.  He tells me that he did not really get this message.  He initially got a little upset that someone might have dropped the ball but I explained to him that we left a message on so-and-so day at so-and-so time and after that he calmed down. No clear evidence of hemolysis although LDH is mildly elevated.  SPEP normal, kappa lambda ratio once again mildly elevated.  Ferritin very elevated. We have discussed about trying B12 for couple weeks, repeating CBC today and if he continues to have downtrending hemoglobin, will arrange for a bone marrow aspiration and biopsy immediately.  We have discussed once again that the working diagnosis is B12 deficiency versus myelodysplasia at this time.  He expressed understanding of the recommendations.  Daughter had a few questions.  We have discussed that the intent of treatment for myelodysplasia is palliative along with supportive care, transfusion etc.

## 2022-01-20 DIAGNOSIS — D649 Anemia, unspecified: Secondary | ICD-10-CM | POA: Diagnosis not present

## 2022-01-20 DIAGNOSIS — I10 Essential (primary) hypertension: Secondary | ICD-10-CM | POA: Diagnosis not present

## 2022-01-20 DIAGNOSIS — Z125 Encounter for screening for malignant neoplasm of prostate: Secondary | ICD-10-CM | POA: Diagnosis not present

## 2022-01-20 DIAGNOSIS — N1832 Chronic kidney disease, stage 3b: Secondary | ICD-10-CM | POA: Diagnosis not present

## 2022-01-20 DIAGNOSIS — Z0000 Encounter for general adult medical examination without abnormal findings: Secondary | ICD-10-CM | POA: Diagnosis not present

## 2022-01-24 ENCOUNTER — Ambulatory Visit: Payer: Medicare Other

## 2022-01-24 DIAGNOSIS — I3139 Other pericardial effusion (noninflammatory): Secondary | ICD-10-CM

## 2022-01-24 DIAGNOSIS — I10 Essential (primary) hypertension: Secondary | ICD-10-CM | POA: Diagnosis not present

## 2022-01-27 DIAGNOSIS — N1832 Chronic kidney disease, stage 3b: Secondary | ICD-10-CM | POA: Diagnosis not present

## 2022-01-27 DIAGNOSIS — E871 Hypo-osmolality and hyponatremia: Secondary | ICD-10-CM | POA: Diagnosis not present

## 2022-01-27 DIAGNOSIS — I129 Hypertensive chronic kidney disease with stage 1 through stage 4 chronic kidney disease, or unspecified chronic kidney disease: Secondary | ICD-10-CM | POA: Diagnosis not present

## 2022-01-27 DIAGNOSIS — R82998 Other abnormal findings in urine: Secondary | ICD-10-CM | POA: Diagnosis not present

## 2022-01-27 DIAGNOSIS — D126 Benign neoplasm of colon, unspecified: Secondary | ICD-10-CM | POA: Diagnosis not present

## 2022-01-31 ENCOUNTER — Encounter: Payer: Self-pay | Admitting: Nurse Practitioner

## 2022-02-02 ENCOUNTER — Inpatient Hospital Stay: Payer: PRIVATE HEALTH INSURANCE | Attending: Hematology and Oncology | Admitting: Hematology and Oncology

## 2022-02-02 ENCOUNTER — Inpatient Hospital Stay: Payer: PRIVATE HEALTH INSURANCE

## 2022-02-02 ENCOUNTER — Other Ambulatory Visit: Payer: Self-pay

## 2022-02-02 VITALS — BP 106/59 | HR 79 | Temp 97.9°F | Resp 16 | Ht 69.0 in | Wt 156.4 lb

## 2022-02-02 DIAGNOSIS — D649 Anemia, unspecified: Secondary | ICD-10-CM | POA: Insufficient documentation

## 2022-02-02 DIAGNOSIS — Z79899 Other long term (current) drug therapy: Secondary | ICD-10-CM | POA: Diagnosis not present

## 2022-02-02 DIAGNOSIS — E538 Deficiency of other specified B group vitamins: Secondary | ICD-10-CM | POA: Diagnosis present

## 2022-02-02 DIAGNOSIS — N189 Chronic kidney disease, unspecified: Secondary | ICD-10-CM | POA: Diagnosis not present

## 2022-02-02 DIAGNOSIS — I129 Hypertensive chronic kidney disease with stage 1 through stage 4 chronic kidney disease, or unspecified chronic kidney disease: Secondary | ICD-10-CM | POA: Insufficient documentation

## 2022-02-02 DIAGNOSIS — E039 Hypothyroidism, unspecified: Secondary | ICD-10-CM | POA: Insufficient documentation

## 2022-02-02 DIAGNOSIS — Z7951 Long term (current) use of inhaled steroids: Secondary | ICD-10-CM | POA: Diagnosis not present

## 2022-02-02 DIAGNOSIS — Z7982 Long term (current) use of aspirin: Secondary | ICD-10-CM | POA: Diagnosis not present

## 2022-02-02 LAB — COMPREHENSIVE METABOLIC PANEL
ALT: 10 U/L (ref 0–44)
AST: 11 U/L — ABNORMAL LOW (ref 15–41)
Albumin: 3.4 g/dL — ABNORMAL LOW (ref 3.5–5.0)
Alkaline Phosphatase: 69 U/L (ref 38–126)
Anion gap: 9 (ref 5–15)
BUN: 24 mg/dL — ABNORMAL HIGH (ref 8–23)
CO2: 25 mmol/L (ref 22–32)
Calcium: 9.6 mg/dL (ref 8.9–10.3)
Chloride: 102 mmol/L (ref 98–111)
Creatinine, Ser: 1.44 mg/dL — ABNORMAL HIGH (ref 0.61–1.24)
GFR, Estimated: 49 mL/min — ABNORMAL LOW (ref >60)
Glucose, Bld: 89 mg/dL (ref 70–99)
Potassium: 4.7 mmol/L (ref 3.5–5.1)
Sodium: 136 mmol/L (ref 135–145)
Total Bilirubin: 0.8 mg/dL (ref 0.3–1.2)
Total Protein: 8 g/dL (ref 6.5–8.1)

## 2022-02-02 LAB — CBC WITH DIFFERENTIAL/PLATELET
Abs Immature Granulocytes: 0.07 10*3/uL (ref 0.00–0.07)
Basophils Absolute: 0.1 10*3/uL (ref 0.0–0.1)
Basophils Relative: 0 %
Eosinophils Absolute: 0.1 10*3/uL (ref 0.0–0.5)
Eosinophils Relative: 1 %
HCT: 27.2 % — ABNORMAL LOW (ref 39.0–52.0)
Hemoglobin: 8.6 g/dL — ABNORMAL LOW (ref 13.0–17.0)
Immature Granulocytes: 1 %
Lymphocytes Relative: 10 %
Lymphs Abs: 1.1 10*3/uL (ref 0.7–4.0)
MCH: 29.1 pg (ref 26.0–34.0)
MCHC: 31.6 g/dL (ref 30.0–36.0)
MCV: 91.9 fL (ref 80.0–100.0)
Monocytes Absolute: 1.2 10*3/uL — ABNORMAL HIGH (ref 0.1–1.0)
Monocytes Relative: 10 %
Neutro Abs: 8.9 10*3/uL — ABNORMAL HIGH (ref 1.7–7.7)
Neutrophils Relative %: 78 %
Platelets: 212 10*3/uL (ref 150–400)
RBC: 2.96 MIL/uL — ABNORMAL LOW (ref 4.22–5.81)
RDW: 15.2 % (ref 11.5–15.5)
WBC: 11.4 10*3/uL — ABNORMAL HIGH (ref 4.0–10.5)
nRBC: 0 % (ref 0.0–0.2)

## 2022-02-02 LAB — VITAMIN B12: Vitamin B-12: 370 pg/mL (ref 180–914)

## 2022-02-02 NOTE — Progress Notes (Signed)
[place] CONSULT NOTE  Patient Care Team: Tisovec, [name], MD as PCP - General (Internal Medicine)  CHIEF COMPLAINTS/PURPOSE OF CONSULTATION:  Normocytic normochromic anemia  ASSESSMENT & PLAN:    This is a pleasant 81 year old male patient with worsening anemia, normocytic normochromic referred to hematology for additional recommendations.  Patient also complains of fatigue and some weight loss but he mentions that the weight loss could be intentional secondary to changing dietary habits.  No evidence of nutritional deficiency, M protein, hemolysis.  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  No new change  Since last visit, he has been taking his B12 supplementation.  He is also going to have a GI consultation soon and is anticipating some colonoscopy but he is not quite sure when that is going to be.  He continues to report fatigue, weight loss.  He tells me that his appetite is actually pretty good.  He denies any hematochezia or melena.  Since there is no improvement in his anemia despite B12 supplementation and progressive weight loss, I think it is reasonable for [place] to proceed with bone marrow aspiration and biopsy.  We will have this scheduled next week.  It is reasonable to consider simultaneous GI consultation for recommendations.  He will return to clinic in about 2 to 3 weeks to review bone marrow results. Thank you for consulting [place] the care of this patient.  Please not hesitate contact us with any additional questions or concerns.  HISTORY OF PRESENTING ILLNESS:  Mitchell Neal 81 y.o. male is here because of normocytic normochromic anemia.  Mitchell Neal is an 81 year old male patient with past medical history significant for COPD, chronic kidney disease referred to hematology for evaluation of anemia.    Since his last visit, he has been taking his B12 as recommended.  He however feels very tired, it takes a lot out of him to fix a meal although  he has good appetite.  He continues to lose weight, lost about 5 to 6 pound since last visit.  He otherwise denies any complaints.  His older daughter was with him at the time of the appointment.   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   Hearing loss    no hearing aids   HYPERTENSION 10/07/2009   Kidney stones    passed stones, no surgery required    SURGICAL HISTORY: Past Surgical History:  Procedure Laterality Date   COLONOSCOPY     polyps   POLYPECTOMY     neck -    TONSILLECTOMY      SOCIAL HISTORY: Social History   Socioeconomic History   Marital status: Widowed    Spouse name: Not on file   Number of children: 2   Years of education: Not on file   Highest education level: Not on file  Occupational History   Not on file  Tobacco Use   Smoking status: Former    Packs/day: 0.50    Years: 25.00    Total pack years: 12.50    Types: Cigarettes    Quit date: 08/26/2010    Years since quitting: 11.4   Smokeless tobacco: Never  Vaping Use   Vaping Use: Never used  Substance and Sexual Activity   Alcohol use: No   Drug use: No   Sexual activity: Not on file  Other Topics Concern   Not on file  Social History Narrative   Not on file   Social Determinants of Health   Financial Resource Strain: Not on file  Food Insecurity: Not on file  Transportation Needs: Not on file  Physical Activity: Not on file  Stress: Not on file  Social Connections: Not on file  Intimate Partner Violence: Not on file    FAMILY HISTORY: Family History  Problem Relation Age of Onset   Heart disease Sister    Hypertension Other    Colon cancer Neg Hx    Colon polyps Neg Hx    Esophageal cancer Neg Hx    Rectal cancer Neg Hx    Stomach cancer Neg Hx      ALLERGIES:  is allergic to prednisolone.  MEDICATIONS:  Current Outpatient Medications  Medication Sig Dispense Refill   ADVAIR DISKUS 250-50 MCG/DOSE AEPB INHALE ONE DOSE BY MOUTH TWICE DAILY 60 each 0   albuterol (PROVENTIL HFA;VENTOLIN HFA) 108 (90 BASE) MCG/ACT inhaler One puffs 3 times daily 1 Inhaler 2   aspirin 81 MG EC tablet Take 81 mg by mouth daily.       FLUoxetine (PROZAC) 20 MG capsule Take 20 mg by mouth daily.     furosemide (LASIX) 20 MG tablet Take 1 tablet by mouth daily.     labetalol (NORMODYNE) 300 MG tablet take 1 tablet by mouth twice a day 180 tablet 3   losartan (COZAAR) 50 MG tablet Take 1 tablet (50 mg total) by mouth 2 (two) times daily. (Patient taking differently: Take 50 mg by mouth daily.) 60 tablet 3   potassium chloride (KLOR-CON) 10 MEQ tablet Take 10 mEq by mouth daily.     valsartan (DIOVAN) 160 MG tablet Take 1 tablet by mouth daily.     Current Facility-Administered Medications  Medication Dose Route Frequency Provider Last Rate Last Admin   0.9 %  sodium chloride infusion  500 mL Intravenous Continuous Nandigam, Kavitha V, MD         PHYSICAL EXAMINATION: ECOG PERFORMANCE STATUS: 0 - Asymptomatic  Vitals:   02/02/22 1415  BP: (!) 106/59  Pulse: 79  Resp: 16  Temp: 97.9 F (36.6 C)  SpO2: 100%   Filed Weights   02/02/22 1415  Weight: 156 lb 6.4 oz (70.9 kg)   Physical Exam Constitutional:      Appearance: Normal appearance.  Musculoskeletal:        General: No swelling. Normal range of motion.  Neurological:     Mental Status: He is alert.      LABORATORY DATA:  I have reviewed the data as listed Lab Results  Component Value Date   WBC 5.9 01/18/2022   HGB 8.7 (L) 01/18/2022   HCT 27.4 (L) 01/18/2022   MCV 91.0 01/18/2022   PLT 212 01/18/2022     Chemistry      Component Value Date/Time   NA 137 01/18/2022 1440   K 4.5 01/18/2022 1440   CL 104 01/18/2022 1440   CO2 24 01/18/2022 1440   BUN 24 (H) 01/18/2022  1440   CREATININE 1.37 (H) 01/18/2022 1440      Component Value Date/Time   CALCIUM 9.1 01/18/2022 1440   ALKPHOS 63 01/18/2022 1440   AST 17 01/18/2022 1440   ALT 15 01/18/2022 1440   BILITOT 0.7 01/18/2022 1440  RADIOGRAPHIC STUDIES: I have personally reviewed the radiological images as listed and agreed with the findings in the report. PCV ECHOCARDIOGRAM COMPLETE  Result Date: 01/25/2022 Echocardiogram 01/24/2022: Left ventricle cavity is normal in size. Mild concentric hypertrophy of the left ventricle. Normal global wall motion. Normal LV systolic function with EF 65%. Doppler evidence of grade I (impaired) diastolic dysfunction, normal LAP. Calculated EF 65%. Structurally normal trileaflet aortic valve.  Mild (Grade I) aortic regurgitation. Mild (Grade I) mitral regurgitation. Small pericardial effusion. There is no hemodynamic significance.   US Abdomen Complete  Result Date: 01/11/2022 CLINICAL DATA:  Weight loss, liver lesion seen in CT EXAM: ABDOMEN ULTRASOUND COMPLETE COMPARISON:  CT done on 01/02/2022 FINDINGS: Gallbladder: Gallbladder is filled with stones and sludge. Technologist did not observe any tenderness over the gallbladder. There is no fluid around the gallbladder. Common bile duct: Diameter: 3.8 mm Liver: There is slightly inhomogeneous increased echogenicity in liver. There is 1.2 x 1.4 cm hyperechoic focus in the liver close to the Port-A bodies, possibly hemangioma. Similar lesion was seen in the previous CT. Technologist observed other hyperechoic foci which were not distinctly seen in previous CT, possibly fatty infiltration. Portal vein is patent on color Doppler imaging with normal direction of blood flow towards the liver. IVC: Limited evaluation Pancreas: Limited evaluation Spleen: Spleen measures 15.2 cm in maximum diameter. There is a 4.3 cm complex cyst in spleen. Right Kidney: Length: 10.6 cm. Echogenicity within normal limits. No mass or hydronephrosis  visualized. Left Kidney: Length: 11.4 cm. Echogenicity within normal limits. No mass or hydronephrosis visualized. Abdominal aorta: Mostly obscured by bowel gas. Other findings: None. IMPRESSION: Technically difficult study due to bowel gas. Fatty liver. 1.4 cm hyperechoic focus in liver suggesting hemangioma. Gallbladder stones.  There are no signs of acute cholecystitis. There is 4.3 cm complex cyst in the spleen. Electronically Signed   By: Elmer Picker M.D.   On: 01/11/2022 12:17    All questions were answered. The patient knows to call the clinic with any problems, questions or concerns. I spent 30 minutes in the care of this patient including H and P, review of records, counseling and coordination of care.     Benay Pike, MD 02/02/2022 2:17 PM

## 2022-02-03 ENCOUNTER — Encounter: Payer: Self-pay | Admitting: Hematology and Oncology

## 2022-02-03 LAB — TSH: TSH: 5.809 u[IU]/mL — ABNORMAL HIGH (ref 0.350–4.500)

## 2022-02-10 ENCOUNTER — Telehealth: Payer: Self-pay | Admitting: Hematology and Oncology

## 2022-02-10 NOTE — Telephone Encounter (Signed)
Cancelled appointments per patient via telephone call.

## 2022-02-13 ENCOUNTER — Inpatient Hospital Stay: Payer: PRIVATE HEALTH INSURANCE

## 2022-02-13 ENCOUNTER — Inpatient Hospital Stay: Payer: PRIVATE HEALTH INSURANCE | Admitting: Hematology and Oncology

## 2022-02-13 ENCOUNTER — Telehealth: Payer: Self-pay | Admitting: *Deleted

## 2022-02-13 NOTE — Telephone Encounter (Signed)
[facility] scheduled for 02/23/2022 with [facility]/NP  Bed available per charge.  Pathology notified and scheduled  Pt made aware of above with verbalized agreement.  Schedule lab 30 minutes post marrow.  Above request sent to scheduling to put on books.

## 2022-02-18 ENCOUNTER — Encounter: Payer: Self-pay | Admitting: Hematology and Oncology

## 2022-02-21 ENCOUNTER — Ambulatory Visit: Payer: Medicare Other | Admitting: Internal Medicine

## 2022-02-22 ENCOUNTER — Ambulatory Visit: Payer: Medicare Other | Admitting: Hematology and Oncology

## 2022-02-23 ENCOUNTER — Inpatient Hospital Stay: Payer: PRIVATE HEALTH INSURANCE

## 2022-02-23 ENCOUNTER — Inpatient Hospital Stay (HOSPITAL_BASED_OUTPATIENT_CLINIC_OR_DEPARTMENT_OTHER): Payer: PRIVATE HEALTH INSURANCE | Admitting: Hematology and Oncology

## 2022-02-23 ENCOUNTER — Other Ambulatory Visit: Payer: Self-pay

## 2022-02-23 VITALS — BP 131/57 | HR 58 | Temp 98.0°F | Resp 17

## 2022-02-23 DIAGNOSIS — D649 Anemia, unspecified: Secondary | ICD-10-CM

## 2022-02-23 LAB — CBC WITH DIFFERENTIAL/PLATELET
Abs Immature Granulocytes: 0.15 10*3/uL — ABNORMAL HIGH (ref 0.00–0.07)
Basophils Absolute: 0.1 10*3/uL (ref 0.0–0.1)
Basophils Relative: 1 %
Eosinophils Absolute: 0.1 10*3/uL (ref 0.0–0.5)
Eosinophils Relative: 1 %
HCT: 23.9 % — ABNORMAL LOW (ref 39.0–52.0)
Hemoglobin: 7.4 g/dL — ABNORMAL LOW (ref 13.0–17.0)
Immature Granulocytes: 2 %
Lymphocytes Relative: 14 %
Lymphs Abs: 1 10*3/uL (ref 0.7–4.0)
MCH: 28.9 pg (ref 26.0–34.0)
MCHC: 31 g/dL (ref 30.0–36.0)
MCV: 93.4 fL (ref 80.0–100.0)
Monocytes Absolute: 1 10*3/uL (ref 0.1–1.0)
Monocytes Relative: 13 %
Neutro Abs: 5.1 10*3/uL (ref 1.7–7.7)
Neutrophils Relative %: 69 %
Platelets: 194 10*3/uL (ref 150–400)
RBC: 2.56 MIL/uL — ABNORMAL LOW (ref 4.22–5.81)
RDW: 14.6 % (ref 11.5–15.5)
WBC: 7.3 10*3/uL (ref 4.0–10.5)
nRBC: 0 % (ref 0.0–0.2)

## 2022-02-23 LAB — COMPREHENSIVE METABOLIC PANEL
ALT: 11 U/L (ref 0–44)
AST: 14 U/L — ABNORMAL LOW (ref 15–41)
Albumin: 3.1 g/dL — ABNORMAL LOW (ref 3.5–5.0)
Alkaline Phosphatase: 82 U/L (ref 38–126)
Anion gap: 6 (ref 5–15)
BUN: 32 mg/dL — ABNORMAL HIGH (ref 8–23)
CO2: 27 mmol/L (ref 22–32)
Calcium: 9.7 mg/dL (ref 8.9–10.3)
Chloride: 104 mmol/L (ref 98–111)
Creatinine, Ser: 1.37 mg/dL — ABNORMAL HIGH (ref 0.61–1.24)
GFR, Estimated: 52 mL/min — ABNORMAL LOW (ref >60)
Glucose, Bld: 97 mg/dL (ref 70–99)
Potassium: 4.7 mmol/L (ref 3.5–5.1)
Sodium: 137 mmol/L (ref 135–145)
Total Bilirubin: 0.7 mg/dL (ref 0.3–1.2)
Total Protein: 7.9 g/dL (ref 6.5–8.1)

## 2022-02-23 LAB — TSH: TSH: 5.743 u[IU]/mL — ABNORMAL HIGH (ref 0.350–4.500)

## 2022-02-23 MED ORDER — LIDOCAINE HCL 2 % IJ SOLN
INTRAMUSCULAR | Status: AC
  Filled 2022-02-23: qty 20

## 2022-02-23 NOTE — Progress Notes (Signed)
INDICATION: Normochromic normocytic anemia    Bone Marrow Biopsy and Aspiration Procedure Note   Informed consent was obtained and potential risks including bleeding, infection and pain were reviewed with the patient.  The patient's name, date of birth, identification, consent and allergies were verified prior to the start of procedure and time out was performed.  The right posterior iliac crest was chosen as the site of biopsy.  The skin was prepped with ChloraPrep.   14 cc of 1% lidocaine was used to provide local anaesthesia.   10 cc of bone marrow aspirate was obtained followed by 1cm biopsy.  Pressure was applied to the biopsy site and bandage was placed over the biopsy site. Patient was made to lie on the back for 15 mins prior to discharge.  The procedure was tolerated well. COMPLICATIONS: None BLOOD LOSS: none The patient was discharged home in stable condition with a 1 week follow up to review results.  Patient was provided with post bone marrow biopsy instructions and instructed to call if there was any bleeding or worsening pain.  Specimens sent for flow cytometry, cytogenetics and additional studies.  Signed Harriette Ohara, MD

## 2022-02-23 NOTE — Progress Notes (Signed)
Biopsy site Clean, dry, intact.  No S&S of bleeding or swelling.  Pt given discharge instructions and patient and family stated understanding.  Discharge to lobby via wheelchair in stable condition

## 2022-02-23 NOTE — Patient Instructions (Signed)
Bone Marrow Aspiration and Bone Marrow Biopsy, Adult Bone marrow aspiration and bone marrow biopsy are procedures that are done to diagnose blood disorders. These procedures may also be done to help diagnose cancer or certain infections. Bone marrow is the soft tissue that is inside the bones. Blood cells are produced in bone marrow. For a bone marrow aspiration, a sample of tissue in liquid form is removed from inside the bone. For a bone marrow biopsy, a small sample of solid bone marrow tissue is removed. You may need these procedures if you get an abnormal result on a blood test called a complete blood count (CBC). The aspiration or biopsy sample is usually taken from the upper part of the hip bone. Sometimes, an aspiration sample is taken from the chest bone (sternum). These samples are examined under a microscope or tested in a lab. Tell a health care provider about: Any allergies you have. All medicines you are taking, including vitamins, herbs, eye drops, creams, and over-the-counter medicines. Any problems you or family members have had with anesthetic medicines. Any bleeding problems or bone disorders you have. Any surgeries you have had. Any medical conditions you have. Any recent infections you have had, including skin infections. Whether you are pregnant or may be pregnant. What are the risks? Your health care provider will talk with you about risks. These may include: Bleeding. Infection. Persistent pain after the procedure. Cracking (fracture) of the bone. Allergic reactions to medicines. Damage to nearby organs, if the sample is taken from the sternum. What happens before the procedure? When to stop eating and drinking Follow instructions from your health care provider about what you may eat and drink. These may include: 8 hours before your procedure Stop eating most foods. Do not eat meat, fried foods, or fatty foods. Eat only light foods, such as toast or crackers. All  liquids are okay except energy drinks and alcohol. 6 hours before your procedure Stop eating. Drink only clear liquids, such as water, clear fruit juice, black coffee, plain tea, and sports drinks. Do not drink energy drinks or alcohol. 2 hours before your procedure Stop drinking all liquids. You may be allowed to take medicines with small sips of water.  Medicines Ask your health care provider about: Changing or stopping your regular medicines. These include any diabetes medicines or blood thinners you take. Taking medicines such as aspirin and ibuprofen. These medicines can thin your blood. Do not take them unless your health care provider tells you to. Taking over-the-counter medicines, vitamins, herbs, and supplements. General instructions If you will be going home right after the procedure, plan to have a responsible adult: Take you home from the hospital or clinic. You will not be allowed to drive. Care for you for the time you are told. Ask your health care provider: How your surgery site will be marked. What steps will be taken to help prevent infection. These may include: Removing hair at the surgery site. Washing skin with a soap that kills germs. What happens during the procedure?  An IV may be inserted into one of your veins. You may be given: A sedative. This helps you relax. Anesthesia. This will: Numb certain areas of your body. Make you fall asleep for surgery. You will be positioned on your stomach or back, based on the site of the procedure. The skin over the site will be cleaned with an antiseptic solution. The bone marrow sample will be removed as follows: A small incision will be made  into your skin. For an aspiration, a hollow needle will be inserted through your skin and into your bone. Bone marrow fluid will be drawn up into the needle. You may feel a sharp, stinging sensation when the fluid is removed. For a biopsy, a hollow needle will be used to remove a  small sample of solid tissue from your bone marrow. The needle will be removed and pressure will be applied to the site. The incision will be closed with stitches (sutures), skin glue, or adhesive strips. A bandage (dressing) will be placed over the incision site and taped in place. Bone marrow fluid or tissue will be sent to a lab for examination. The procedure may vary among health care providers and hospitals. What happens after the procedure? Your blood pressure, heart rate, breathing rate, and blood oxygen level will be monitored until you leave the hospital or clinic. Your IV will be removed, and the incision site will be checked for bleeding. It is up to you to get the results of your procedure. Ask your health care provider, or the department that is doing the procedure, when your results will be ready. This information is not intended to replace advice given to you by your health care provider. Make sure you discuss any questions you have with your health care provider. Document Revised: 07/18/2021 Document Reviewed: 07/18/2021 Elsevier Patient Education  [place].

## 2022-02-24 ENCOUNTER — Telehealth: Payer: Self-pay | Admitting: *Deleted

## 2022-02-24 NOTE — Telephone Encounter (Signed)
Pt daughter called to cancel Monday's appt for father. She stated that she wanted to make sure all results were in before bringing father in. In basket sent to scheduler and Dr.Iruku for update

## 2022-02-27 ENCOUNTER — Encounter (HOSPITAL_COMMUNITY): Payer: Self-pay | Admitting: Emergency Medicine

## 2022-02-27 ENCOUNTER — Other Ambulatory Visit: Payer: Self-pay | Admitting: *Deleted

## 2022-02-27 ENCOUNTER — Inpatient Hospital Stay (HOSPITAL_COMMUNITY)
Admission: EM | Admit: 2022-02-27 | Discharge: 2022-03-07 | DRG: 181 | Disposition: A | Discharge status: Hospice | Payer: Medicare Other | Source: Ambulatory Visit | Attending: Internal Medicine | Admitting: Internal Medicine

## 2022-02-27 ENCOUNTER — Emergency Department (HOSPITAL_COMMUNITY): Payer: Medicare Other

## 2022-02-27 ENCOUNTER — Inpatient Hospital Stay: Payer: Medicare Other | Admitting: Hematology and Oncology

## 2022-02-27 ENCOUNTER — Inpatient Hospital Stay: Payer: Medicare Other | Attending: Hematology and Oncology

## 2022-02-27 ENCOUNTER — Other Ambulatory Visit: Payer: Self-pay

## 2022-02-27 VITALS — BP 109/37 | HR 51 | Temp 97.5°F | Resp 16 | Ht 69.0 in | Wt 146.9 lb

## 2022-02-27 DIAGNOSIS — C799 Secondary malignant neoplasm of unspecified site: Secondary | ICD-10-CM | POA: Diagnosis not present

## 2022-02-27 DIAGNOSIS — Z7189 Other specified counseling: Secondary | ICD-10-CM | POA: Diagnosis not present

## 2022-02-27 DIAGNOSIS — Z6821 Body mass index (BMI) 21.0-21.9, adult: Secondary | ICD-10-CM

## 2022-02-27 DIAGNOSIS — J4489 Other specified chronic obstructive pulmonary disease: Secondary | ICD-10-CM | POA: Diagnosis present

## 2022-02-27 DIAGNOSIS — D630 Anemia in neoplastic disease: Secondary | ICD-10-CM | POA: Diagnosis present

## 2022-02-27 DIAGNOSIS — R52 Pain, unspecified: Secondary | ICD-10-CM | POA: Diagnosis not present

## 2022-02-27 DIAGNOSIS — C3411 Malignant neoplasm of upper lobe, right bronchus or lung: Secondary | ICD-10-CM | POA: Diagnosis not present

## 2022-02-27 DIAGNOSIS — I129 Hypertensive chronic kidney disease with stage 1 through stage 4 chronic kidney disease, or unspecified chronic kidney disease: Secondary | ICD-10-CM | POA: Diagnosis not present

## 2022-02-27 DIAGNOSIS — D509 Iron deficiency anemia, unspecified: Secondary | ICD-10-CM | POA: Diagnosis present

## 2022-02-27 DIAGNOSIS — D649 Anemia, unspecified: Secondary | ICD-10-CM

## 2022-02-27 DIAGNOSIS — Z7951 Long term (current) use of inhaled steroids: Secondary | ICD-10-CM

## 2022-02-27 DIAGNOSIS — Z66 Do not resuscitate: Secondary | ICD-10-CM | POA: Diagnosis present

## 2022-02-27 DIAGNOSIS — F32A Depression, unspecified: Secondary | ICD-10-CM | POA: Diagnosis present

## 2022-02-27 DIAGNOSIS — C7800 Secondary malignant neoplasm of unspecified lung: Secondary | ICD-10-CM | POA: Diagnosis present

## 2022-02-27 DIAGNOSIS — Z79899 Other long term (current) drug therapy: Secondary | ICD-10-CM | POA: Diagnosis not present

## 2022-02-27 DIAGNOSIS — R296 Repeated falls: Secondary | ICD-10-CM | POA: Diagnosis present

## 2022-02-27 DIAGNOSIS — R627 Adult failure to thrive: Secondary | ICD-10-CM | POA: Diagnosis not present

## 2022-02-27 DIAGNOSIS — H9190 Unspecified hearing loss, unspecified ear: Secondary | ICD-10-CM | POA: Diagnosis present

## 2022-02-27 DIAGNOSIS — Z1152 Encounter for screening for COVID-19: Secondary | ICD-10-CM | POA: Diagnosis not present

## 2022-02-27 DIAGNOSIS — R0602 Shortness of breath: Secondary | ICD-10-CM | POA: Diagnosis not present

## 2022-02-27 DIAGNOSIS — M549 Dorsalgia, unspecified: Secondary | ICD-10-CM | POA: Diagnosis present

## 2022-02-27 DIAGNOSIS — R918 Other nonspecific abnormal finding of lung field: Secondary | ICD-10-CM | POA: Diagnosis not present

## 2022-02-27 DIAGNOSIS — E538 Deficiency of other specified B group vitamins: Secondary | ICD-10-CM | POA: Diagnosis present

## 2022-02-27 DIAGNOSIS — E861 Hypovolemia: Secondary | ICD-10-CM | POA: Diagnosis not present

## 2022-02-27 DIAGNOSIS — W19XXXA Unspecified fall, initial encounter: Secondary | ICD-10-CM | POA: Diagnosis present

## 2022-02-27 DIAGNOSIS — R4589 Other symptoms and signs involving emotional state: Secondary | ICD-10-CM | POA: Diagnosis not present

## 2022-02-27 DIAGNOSIS — N179 Acute kidney failure, unspecified: Secondary | ICD-10-CM | POA: Diagnosis not present

## 2022-02-27 DIAGNOSIS — M6250 Muscle wasting and atrophy, not elsewhere classified, unspecified site: Secondary | ICD-10-CM | POA: Diagnosis present

## 2022-02-27 DIAGNOSIS — Z87891 Personal history of nicotine dependence: Secondary | ICD-10-CM

## 2022-02-27 DIAGNOSIS — R0600 Dyspnea, unspecified: Secondary | ICD-10-CM | POA: Diagnosis not present

## 2022-02-27 DIAGNOSIS — I2510 Atherosclerotic heart disease of native coronary artery without angina pectoris: Secondary | ICD-10-CM | POA: Diagnosis present

## 2022-02-27 DIAGNOSIS — T148XXA Other injury of unspecified body region, initial encounter: Secondary | ICD-10-CM | POA: Diagnosis present

## 2022-02-27 DIAGNOSIS — J9859 Other diseases of mediastinum, not elsewhere classified: Secondary | ICD-10-CM | POA: Diagnosis not present

## 2022-02-27 DIAGNOSIS — G8929 Other chronic pain: Secondary | ICD-10-CM | POA: Diagnosis present

## 2022-02-27 DIAGNOSIS — Z515 Encounter for palliative care: Secondary | ICD-10-CM | POA: Diagnosis not present

## 2022-02-27 DIAGNOSIS — K5909 Other constipation: Secondary | ICD-10-CM | POA: Diagnosis present

## 2022-02-27 DIAGNOSIS — F419 Anxiety disorder, unspecified: Secondary | ICD-10-CM | POA: Diagnosis present

## 2022-02-27 DIAGNOSIS — Z602 Problems related to living alone: Secondary | ICD-10-CM | POA: Diagnosis present

## 2022-02-27 DIAGNOSIS — R451 Restlessness and agitation: Secondary | ICD-10-CM

## 2022-02-27 DIAGNOSIS — J439 Emphysema, unspecified: Secondary | ICD-10-CM

## 2022-02-27 DIAGNOSIS — E039 Hypothyroidism, unspecified: Secondary | ICD-10-CM | POA: Diagnosis present

## 2022-02-27 DIAGNOSIS — N189 Chronic kidney disease, unspecified: Secondary | ICD-10-CM | POA: Diagnosis present

## 2022-02-27 DIAGNOSIS — N401 Enlarged prostate with lower urinary tract symptoms: Secondary | ICD-10-CM

## 2022-02-27 DIAGNOSIS — Z87442 Personal history of urinary calculi: Secondary | ICD-10-CM

## 2022-02-27 DIAGNOSIS — R634 Abnormal weight loss: Secondary | ICD-10-CM | POA: Diagnosis present

## 2022-02-27 DIAGNOSIS — R0902 Hypoxemia: Secondary | ICD-10-CM

## 2022-02-27 DIAGNOSIS — Z888 Allergy status to other drugs, medicaments and biological substances status: Secondary | ICD-10-CM

## 2022-02-27 DIAGNOSIS — I700 Atherosclerosis of aorta: Secondary | ICD-10-CM | POA: Diagnosis not present

## 2022-02-27 DIAGNOSIS — R590 Localized enlarged lymph nodes: Secondary | ICD-10-CM | POA: Diagnosis present

## 2022-02-27 DIAGNOSIS — K8020 Calculus of gallbladder without cholecystitis without obstruction: Secondary | ICD-10-CM | POA: Diagnosis not present

## 2022-02-27 DIAGNOSIS — I3139 Other pericardial effusion (noninflammatory): Secondary | ICD-10-CM

## 2022-02-27 DIAGNOSIS — I10 Essential (primary) hypertension: Secondary | ICD-10-CM | POA: Diagnosis present

## 2022-02-27 DIAGNOSIS — Z8249 Family history of ischemic heart disease and other diseases of the circulatory system: Secondary | ICD-10-CM

## 2022-02-27 LAB — CBC WITH DIFFERENTIAL (CANCER CENTER ONLY)
Abs Immature Granulocytes: 0.08 10*3/uL — ABNORMAL HIGH (ref 0.00–0.07)
Basophils Absolute: 0.1 10*3/uL (ref 0.0–0.1)
Basophils Relative: 1 %
Eosinophils Absolute: 0.1 10*3/uL (ref 0.0–0.5)
Eosinophils Relative: 1 %
HCT: 22.9 % — ABNORMAL LOW (ref 39.0–52.0)
Hemoglobin: 7.1 g/dL — ABNORMAL LOW (ref 13.0–17.0)
Immature Granulocytes: 1 %
Lymphocytes Relative: 16 %
Lymphs Abs: 1.1 10*3/uL (ref 0.7–4.0)
MCH: 29.1 pg (ref 26.0–34.0)
MCHC: 31 g/dL (ref 30.0–36.0)
MCV: 93.9 fL (ref 80.0–100.0)
Monocytes Absolute: 0.9 10*3/uL (ref 0.1–1.0)
Monocytes Relative: 13 %
Neutro Abs: 4.6 10*3/uL (ref 1.7–7.7)
Neutrophils Relative %: 68 %
Platelet Count: 214 10*3/uL (ref 150–400)
RBC: 2.44 MIL/uL — ABNORMAL LOW (ref 4.22–5.81)
RDW: 14.5 % (ref 11.5–15.5)
WBC Count: 6.7 10*3/uL (ref 4.0–10.5)
nRBC: 0 % (ref 0.0–0.2)

## 2022-02-27 LAB — CMP (CANCER CENTER ONLY)
ALT: 11 U/L (ref 0–44)
AST: 15 U/L (ref 15–41)
Albumin: 3.2 g/dL — ABNORMAL LOW (ref 3.5–5.0)
Alkaline Phosphatase: 75 U/L (ref 38–126)
Anion gap: 7 (ref 5–15)
BUN: 29 mg/dL — ABNORMAL HIGH (ref 8–23)
CO2: 26 mmol/L (ref 22–32)
Calcium: 9.5 mg/dL (ref 8.9–10.3)
Chloride: 102 mmol/L (ref 98–111)
Creatinine: 1.34 mg/dL — ABNORMAL HIGH (ref 0.61–1.24)
GFR, Estimated: 53 mL/min — ABNORMAL LOW (ref >60)
Glucose, Bld: 91 mg/dL (ref 70–99)
Potassium: 4.5 mmol/L (ref 3.5–5.1)
Sodium: 135 mmol/L (ref 135–145)
Total Bilirubin: 0.6 mg/dL (ref 0.3–1.2)
Total Protein: 8.1 g/dL (ref 6.5–8.1)

## 2022-02-27 LAB — URINALYSIS, ROUTINE W REFLEX MICROSCOPIC
Bacteria, UA: NONE SEEN
Bilirubin Urine: NEGATIVE
Glucose, UA: NEGATIVE mg/dL
Ketones, ur: NEGATIVE mg/dL
Leukocytes,Ua: NEGATIVE
Nitrite: NEGATIVE
Protein, ur: NEGATIVE mg/dL
RBC / HPF: >50 RBC/hpf — ABNORMAL HIGH (ref 0–5)
Specific Gravity, Urine: >1.046 — ABNORMAL HIGH (ref 1.005–1.030)
pH: 5 (ref 5.0–8.0)

## 2022-02-27 LAB — LACTIC ACID, PLASMA: Lactic Acid, Venous: 1.2 mmol/L (ref 0.5–1.9)

## 2022-02-27 LAB — TROPONIN I (HIGH SENSITIVITY)
Troponin I (High Sensitivity): 5 ng/L (ref <18)
Troponin I (High Sensitivity): 5 ng/L (ref <18)

## 2022-02-27 LAB — PROTIME-INR
INR: 1.4 — ABNORMAL HIGH (ref 0.8–1.2)
Prothrombin Time: 16.6 seconds — ABNORMAL HIGH (ref 11.4–15.2)

## 2022-02-27 LAB — RESP PANEL BY RT-PCR (FLU A&B, COVID) ARPGX2
Influenza A by PCR: NEGATIVE
Influenza B by PCR: NEGATIVE
SARS Coronavirus 2 by RT PCR: NEGATIVE

## 2022-02-27 LAB — BRAIN NATRIURETIC PEPTIDE: B Natriuretic Peptide: 169.6 pg/mL — ABNORMAL HIGH (ref 0.0–100.0)

## 2022-02-27 LAB — TSH: TSH: 7.65 u[IU]/mL — ABNORMAL HIGH (ref 0.350–4.500)

## 2022-02-27 LAB — PREPARE RBC (CROSSMATCH)

## 2022-02-27 LAB — ABO/RH: ABO/RH(D): A POS

## 2022-02-27 MED ORDER — SODIUM CHLORIDE 0.9 % IV SOLN
10.0000 mL/h | Freq: Once | INTRAVENOUS | Status: AC
  Administered 2022-02-27: 10 mL/h via INTRAVENOUS

## 2022-02-27 MED ORDER — SODIUM CHLORIDE 0.9 % IV BOLUS
500.0000 mL | Freq: Once | INTRAVENOUS | Status: AC
  Administered 2022-02-27: 500 mL via INTRAVENOUS

## 2022-02-27 MED ORDER — IOHEXOL 350 MG/ML SOLN
100.0000 mL | Freq: Once | INTRAVENOUS | Status: AC | PRN
  Administered 2022-02-27: 100 mL via INTRAVENOUS

## 2022-02-27 NOTE — ED Triage Notes (Signed)
Patient got transfer from Cancer center c/o weakness and low hemoglobin. Per report pt hgb was 7. Per report pt was had bone marrow biopsy last Monday and still waiting for results.

## 2022-02-27 NOTE — H&P (Signed)
History and Physical    Patient: Mitchell Neal UUV:[id number] DOB: 1940-05-12 DOA: 02/27/2022 DOS: the patient was seen and examined on 02/28/2022 PCP: Tisovec, [name], MD  Patient coming from: Home  Chief Complaint:  Chief Complaint  Patient presents with   Weakness   HPI: Mitchell Neal is a 81 y.o. male with medical history significant of HTN, pericardial effusion, iron deficiency anemia who was sent by hematology for worsening shortness of breath and weakness.   Patient reports symptoms of increasing shortness of breath with exertion, weakness and approximately 45 pound weight loss in the past 3 months. Pt has been following with onc/hematology for worsening normocytic normochromic anemia. Initially underwent a trial of B12 for deficiency but still had no improvement. Ultimately underwent bone marrow biopsy to rule out myelodysplastic syndrome which is still pending.  Has history of tobacco use of 1 pack/day since he has been 81yo but quit 11 years ago.  In the ED, temperature 98 F, bradycardia in the high 50s, normotensive on room air. Hemoglobin drawn outpatient of 7.1 which is gradually trended from 59-monthago. Creatinine is mildly elevated 1.34 with a prior normal of 1.14.  Chest x-ray was concerning for bilateral upper lobe malignancy.  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 Review of Systems: As mentioned in the history of present illness. All other systems reviewed and are negative. 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 His smoking use included cigarettes. He has a 12.50 pack-year smoking history. He has never used smokeless tobacco. He reports that he does not drink alcohol and does not use drugs.  Allergies  Allergen Reactions   Prednisolone     unknown    Family History  Problem Relation Age of Onset   Heart disease Sister    Hypertension Other    Colon cancer Neg Hx    Colon polyps Neg Hx    Esophageal cancer Neg Hx    Rectal cancer Neg Hx    Stomach cancer Neg Hx     Prior to Admission medications   Medication Sig Start Date End Date Taking? Authorizing Provider  ADVAIR DISKUS 250-50 MCG/DOSE AEPB INHALE ONE DOSE BY MOUTH TWICE DAILY Patient taking differently: Inhale 1 puff into the lungs in the morning and at bedtime. 05/29/12  Yes TDorena Cookey MD  albuterol (PROVENTIL HFA;VENTOLIN HFA) 108 (90 BASE) MCG/ACT inhaler One puffs 3 times daily Patient taking differently: Inhale 2 puffs into the lungs every 6 (six) hours as needed for shortness of breath. 06/12/11  Yes KMarletta Lor MD  Cyanocobalamin (VITAMIN B-12 PO) Take 1 tablet by mouth daily.   Yes [provider]  FLUoxetine (PROZAC) 20 MG capsule Take 20 mg by mouth daily.   Yes [provider]  fluticasone (FLONASE) 50 MCG/ACT nasal spray Place 2 sprays into both nostrils daily.   Yes [provider]  furosemide (LASIX) 20 MG tablet Take 1 tablet by mouth daily. 01/05/22  Yes [provider]  labetalol (NORMODYNE) 200 MG tablet Take 300 mg by mouth 2 (two) times daily.   Yes [provider]  potassium chloride (KLOR-CON) 10 MEQ tablet Take 10 mEq by mouth daily. 01/05/22  Yes [provider]  valsartan (DIOVAN) 160 MG tablet Take 1  tablet by mouth daily. 12/25/19  Yes [provider]  labetalol (NORMODYNE) 300 MG tablet take 1 tablet by mouth twice a day Patient not taking: Reported on 02/27/2022 01/30/12   Dorena Cookey, MD  losartan (COZAAR) 50 MG tablet Take 1 tablet (50 mg total) by mouth 2 (two) times daily. Patient not taking: Reported on 02/27/2022 05/26/11   Dorena Cookey, MD    Physical Exam: Vitals:   02/27/22 2130 02/27/22 2300 02/27/22 2323 02/28/22 0120  BP: (!) 119/51 (!) 125/58 (!) 127/59 135/70  Pulse: (!) 59 (!) 59 60 64  Resp: _0 Temp:  98 F (36.7 C) 98.8 F (37.1 C) 98 F (36.7 C)  SpO2: 100% 100% 100% 100%  Weight:      Height:       Constitutional: NAD, calm, comfortable, elderly male laying flat in bed  Eyes: lids and conjunctivae normal ENMT: Mucous membranes are moist.  Neck: normal, supple Respiratory: clear to auscultation bilaterally, no wheezing, no crackles. Normal respiratory effort. No accessory muscle use.  Cardiovascular: Regular rate and rhythm, no murmurs / rubs / gallops. No extremity edema.  Abdomen: no tenderness,  Bowel sounds positive.  Musculoskeletal: no clubbing / cyanosis. No joint deformity upper and lower extremities. Good ROM, no contractures. Normal muscle tone.  Skin: no rashes, lesions, ulcers. No induration Neurologic: CN 2-12 grossly intact.  Strength 5/5 in all 4.  Psychiatric: Normal judgment and insight. Alert and oriented x 3. Normal mood. Data Reviewed:  See HPI  Assessment and Plan: * Symptomatic anemia -Hgb of 7.1 on presentation and symptomatic. 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 Would like to be DNR status.  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 Observation status is judged to be reasonable and necessary in order to provide the required intensity of service to ensure the patient's safety. The patient's presenting symptoms, physical exam  findings, and initial radiographic and laboratory data in the context of their medical condition is felt to place them at decreased risk for further clinical deterioration. Furthermore, it is anticipated that the patient will be medically stable for discharge from the hospital within 2 midnights of admission.   Author: Orene Desanctis, DO 02/28/2022 1:45 AM  For on call review [email or web address].

## 2022-02-27 NOTE — Progress Notes (Unsigned)
[facility] CONSULT NOTE  Patient Care Team: Tisovec, [name], MD as PCP - General (Internal Medicine)  CHIEF COMPLAINTS/PURPOSE OF CONSULTATION:  Normocytic normochromic anemia  ASSESSMENT & PLAN:   This is a pleasant 81 year old male patient with worsening anemia, normocytic normochromic referred to hematology for additional recommendations.  He was initially referred to hematology for anemia evaluation.  We did investigation which showed mild B12 deficiency, mild hypothyroidism but no overt explanation for his worsening anemia hence we have proceeded with bone marrow aspiration and biopsy.  He was here on 1132 to the bone marrow biopsy.  Results are unfortunately pending at the time of my visit yesterday.   We initially got a message to cancel the appointment since the results are not all available but daughter decided she would bring him anyway because he was not well.  She was really worried about him and making it through the weekend with how weak he is.  He continues to tell me that he is eating well, feels well, did not really want to go to the hospital. We discussed that he may actually have to consider doing this because he is weaker with rapid weight loss every time I see him and anemia continues to worsen.  We have sent him to the emergency room for further evaluation.  It appears he had imaging of the chest while he was in the ER which did showed findings concerning for lung malignancy with mediastinal and hilar adenopathy and bilateral upper lobe masses.  In the past when I recommended imaging for evaluation of weight loss, he did not want to proceed with it at that time.  He was also confident that he had a CT abdomen done by Dr. Osborne Casco which did not show any signs of primary malignancy. I tried calling the daughter to review the results from the CT scan, no answer, left voicemail to call me back.  At this time I will try to follow-up with him while he is in the  hospital and and discuss plan of care. We may need a biopsy to review exact treatment recommendations and prognosis.  I will see if he is willing to even consider biopsy. Thank you for consulting [place] the care of this patient.  Please not hesitate to contact us with any additional questions or concerns.  HISTORY OF PRESENTING ILLNESS:  Mitchell Neal 81 y.o. male is here because of normocytic normochromic anemia.  Mitchell Neal is an 81 year old male patient with past medical history significant for COPD, chronic kidney disease referred to hematology for evaluation of anemia.   No evidence of nutritional deficiency except for B12 deficiency, M protein, hemolysis.   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  He was started on B 12 supplementation but continued to develop progressive anemia and rapid weight loss. We agreed to proceed with BMB. He had BMB on 11/30.  He is here for an unplanned visit, daughter brought him because he is doing well. He continues to lose weight, hasn't been eating well, she was very concerned that he will not make it if she doesn't bring him to the hospital. He tells me that he is eating very many calories, although daughter said they brought him plenty of food but he hasn't eaten much at all. He lives alone and is weaker, so she was very concerned about him. In the past when we asked him to consider imaging, he was reluctant.  At he tells me today that he  is very worried about the medical bills that might happen if he goes to the hospital how he can afford these.  But he is agreeable to going to the hospital if needed.  He continues to deny any other complaints such as cough, chest pain, shortness of breath, blood in his stool or black stool.  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   Hearing loss    no hearing aids   HYPERTENSION 10/07/2009   Kidney stones    passed stones, no surgery required    SURGICAL HISTORY: Past Surgical History:  Procedure Laterality Date   COLONOSCOPY     polyps   POLYPECTOMY     neck -    TONSILLECTOMY      SOCIAL HISTORY: Social History   Socioeconomic History   Marital status: Widowed    Spouse name: Not on file   Number of children: 2   Years of education: Not on file   Highest education level: Not on file  Occupational History   Not on file  Tobacco Use   Smoking status: Former    Packs/day: 0.50    Years: 25.00    Total pack years: 12.50    Types: Cigarettes    Quit date: 08/26/2010    Years since quitting: 11.5   Smokeless tobacco: Never  Vaping Use   Vaping Use: Never used  Substance and Sexual Activity   Alcohol use: No   Drug use: No   Sexual activity: Not on file  Other Topics Concern   Not on file  Social History Narrative   Not on file   Social Determinants of Health   Financial Resource Strain: Not on file  Food Insecurity: Not on file  Transportation Needs: Not on file  Physical Activity: Not on file  Stress: Not on file  Social Connections: Not on file  Intimate Partner Violence: Not on file    FAMILY HISTORY: Family History  Problem Relation Age of Onset   Heart disease Sister    Hypertension Other    Colon cancer Neg Hx    Colon polyps Neg Hx    Esophageal cancer Neg Hx    Rectal cancer Neg Hx    Stomach cancer Neg Hx     ALLERGIES:  is allergic to prednisolone.  MEDICATIONS:  Current Outpatient Medications  Medication Sig Dispense Refill   ADVAIR DISKUS 250-50 MCG/DOSE AEPB INHALE ONE DOSE BY MOUTH TWICE DAILY 60 each 0   albuterol (PROVENTIL HFA;VENTOLIN HFA) 108 (90 BASE) MCG/ACT inhaler One puffs 3 times daily 1 Inhaler 2   aspirin 81 MG EC tablet Take 81 mg by mouth daily.       FLUoxetine (PROZAC) 20 MG capsule Take 20 mg by  mouth daily.     furosemide (LASIX) 20 MG tablet Take 1 tablet by mouth daily.     labetalol (NORMODYNE) 300 MG tablet take 1 tablet by mouth twice a day 180 tablet 3   losartan (COZAAR) 50 MG tablet Take 1 tablet (50 mg total) by mouth 2 (two) times daily. (Patient taking differently: Take 50 mg by mouth daily.) 60 tablet 3   potassium chloride (KLOR-CON) 10 MEQ tablet Take 10 mEq by mouth daily.  valsartan (DIOVAN) 160 MG tablet Take 1 tablet by mouth daily.     Current Facility-Administered Medications  Medication Dose Route Frequency Provider Last Rate Last Admin   0.9 %  sodium chloride infusion  500 mL Intravenous Continuous Nandigam, Kavitha V, MD         PHYSICAL EXAMINATION: ECOG PERFORMANCE STATUS: 0 - Asymptomatic  Vitals:   02/27/22 1422  BP: (!) 109/37  Pulse: (!) 51  Resp: 16  Temp: (!) 97.5 F (36.4 C)  SpO2: 100%   Filed Weights   02/27/22 1422  Weight: 146 lb 14.4 oz (66.6 kg)   Physical Exam Constitutional:      Appearance: He is ill-appearing.     Comments: He appears very weak, he was in a wheelchair today  Musculoskeletal:        General: No swelling. Normal range of motion.  Neurological:     Mental Status: He is alert.      LABORATORY DATA:  I have reviewed the data as listed Lab Results  Component Value Date   WBC 6.7 02/27/2022   HGB 7.1 (L) 02/27/2022   HCT 22.9 (L) 02/27/2022   MCV 93.9 02/27/2022   PLT 214 02/27/2022     Chemistry      Component Value Date/Time   NA 137 02/23/2022 0850   K 4.7 02/23/2022 0850   CL 104 02/23/2022 0850   CO2 27 02/23/2022 0850   BUN 32 (H) 02/23/2022 0850   CREATININE 1.37 (H) 02/23/2022 0850      Component Value Date/Time   CALCIUM 9.7 02/23/2022 0850   ALKPHOS 82 02/23/2022 0850   AST 14 (L) 02/23/2022 0850   ALT 11 02/23/2022 0850   BILITOT 0.7 02/23/2022 0850       RADIOGRAPHIC STUDIES: I have personally reviewed the radiological images as listed and agreed with the findings in  the report. No results found.  All questions were answered. The patient knows to call the clinic with any problems, questions or concerns. I spent 30 minutes in the care of this patient including H and P, review of records, counseling and coordination of care.     Benay Pike, MD 02/27/2022 2:45 PM

## 2022-02-27 NOTE — ED Provider Notes (Signed)
[facility] DEPT Provider Note   CSN: [id number] Arrival date & time: 02/27/22  1525     History {Add pertinent medical, surgical, social history, OB history to HPI:1} Chief Complaint  Patient presents with   Weakness    Mitchell Neal is a 81 y.o. male.  He has a history of hypertension COPD and more recently iron deficiency anemia.  He is seeing oncology Dr. Chryl Heck at the cancer center for anemia work-up.  He was sent down there after seeing them today.  He is endorsing increased shortness of breath dyspnea on exertion unsteady gait general weakness over the past few weeks.  He has not seen any gross bleeding.  He does endorse some urinary urgency.  Minimal cough.  Sometimes has some pain across his chest with exertion.  He had a fall trying to get to the toilet.  He has some chronic constipation and some intermittent low abdominal pain  The history is provided by the patient and a relative.  Weakness Severity:  Moderate Onset quality:  Gradual Duration:  2 weeks Timing:  Constant Progression:  Worsening Chronicity:  New Relieved by:  Nothing Worsened by:  Activity Ineffective treatments:  Rest Associated symptoms: abdominal pain, chest pain, cough, difficulty walking, falls, shortness of breath and urgency   Associated symptoms: no dysuria, no fever, no foul-smelling urine, no headaches, no hematochezia, no nausea and no vomiting        Home Medications Prior to Admission medications   Medication Sig Start Date End Date Taking? Authorizing Provider  ADVAIR DISKUS 250-50 MCG/DOSE AEPB INHALE ONE DOSE BY MOUTH TWICE DAILY Patient taking differently: Inhale 1 puff into the lungs in the morning and at bedtime. 05/29/12   Dorena Cookey, MD  albuterol (PROVENTIL HFA;VENTOLIN HFA) 108 (90 BASE) MCG/ACT inhaler One puffs 3 times daily 06/12/11   Marletta Lor, MD  aspirin 81 MG EC tablet Take 81 mg by mouth daily.      [provider]   FLUoxetine (PROZAC) 20 MG capsule Take 20 mg by mouth daily.    [provider]  furosemide (LASIX) 20 MG tablet Take 1 tablet by mouth daily. 01/05/22   [provider]  labetalol (NORMODYNE) 300 MG tablet take 1 tablet by mouth twice a day 01/30/12   Dorena Cookey, MD  losartan (COZAAR) 50 MG tablet Take 1 tablet (50 mg total) by mouth 2 (two) times daily. Patient taking differently: Take 50 mg by mouth daily. 05/26/11   Dorena Cookey, MD  potassium chloride (KLOR-CON) 10 MEQ tablet Take 10 mEq by mouth daily. 01/05/22   [provider]  valsartan (DIOVAN) 160 MG tablet Take 1 tablet by mouth daily. 12/25/19   [provider]      Allergies    Prednisolone    Review of Systems   Review of Systems  Constitutional:  Negative for fever.  HENT:  Positive for sinus pressure. Negative for sinus pain and sore throat.   Eyes:  Negative for pain.  Respiratory:  Positive for cough and shortness of breath.   Cardiovascular:  Positive for chest pain.  Gastrointestinal:  Positive for abdominal pain and constipation. Negative for anal bleeding, hematochezia, nausea and vomiting.  Genitourinary:  Positive for urgency. Negative for dysuria.  Musculoskeletal:  Positive for falls and gait problem.  Skin:  Negative for rash.  Neurological:  Positive for weakness. Negative for headaches.    Physical Exam Updated Vital Signs Ht 5\' 9"  (1.753  m)   Wt 66.6 kg   BMI 21.68 kg/m  Physical Exam Vitals and nursing note reviewed.  Constitutional:      General: He is not in acute distress.    Appearance: Normal appearance. He is well-developed.  HENT:     Head: Normocephalic and atraumatic.  Eyes:     Conjunctiva/sclera: Conjunctivae normal.  Cardiovascular:     Rate and Rhythm: Normal rate and regular rhythm.     Heart sounds: No murmur heard. Pulmonary:     Effort: Pulmonary effort is normal. No respiratory distress.     Breath sounds: Normal breath sounds.   Abdominal:     Palpations: Abdomen is soft.     Tenderness: There is no abdominal tenderness. There is no guarding or rebound.  Musculoskeletal:        General: No tenderness or deformity. Normal range of motion.     Cervical back: Neck supple.  Skin:    General: Skin is warm and dry.     Capillary Refill: Capillary refill takes less than 2 seconds.  Neurological:     General: No focal deficit present.     Mental Status: He is alert.     ED Results / Procedures / Treatments   Labs (all labs ordered are listed, but only abnormal results are displayed) Labs Reviewed  RESP PANEL BY RT-PCR (FLU A&B, COVID) [id number]  PROTIME-INR  URINALYSIS, ROUTINE W REFLEX MICROSCOPIC  TYPE AND SCREEN    EKG None  Radiology No results found.  Procedures Procedures  {Document cardiac monitor, telemetry assessment procedure when appropriate:1}  Medications Ordered in ED Medications  sodium chloride 0.9 % bolus 500 mL (has no administration in time range)    ED Course/ Medical Decision Making/ A&P                           Medical Decision Making Amount and/or Complexity of Data Reviewed Labs: ordered. Radiology: ordered.   This patient complains of ***; this involves an extensive number of treatment Options and is a complaint that carries with it a high risk of complications and morbidity. The differential includes ***  I ordered, reviewed and interpreted labs, which included *** I ordered medication *** and reviewed PMP when indicated. I ordered imaging studies which included *** and I independently    visualized and interpreted imaging which showed *** Additional history obtained from *** Previous records obtained and reviewed *** I consulted *** and discussed lab and imaging findings and discussed disposition.  Cardiac monitoring reviewed, *** Social determinants considered, *** Critical Interventions: ***  After the interventions stated above, I reevaluated the patient  and found *** Admission and further testing considered, ***   {Document critical care time when appropriate:1} {Document review of labs and clinical decision tools ie heart score, Chads2Vasc2 etc:1}  {Document your independent review of radiology images, and any outside records:1} {Document your discussion with family members, caretakers, and with consultants:1} {Document social determinants of health affecting pt's care:1} {Document your decision making why or why not admission, treatments were needed:1} Final Clinical Impression(s) / ED Diagnoses Final diagnoses:  None    Rx / DC Orders ED Discharge Orders     None

## 2022-02-28 DIAGNOSIS — D649 Anemia, unspecified: Secondary | ICD-10-CM | POA: Diagnosis not present

## 2022-02-28 LAB — CBC
HCT: 27.3 % — ABNORMAL LOW (ref 39.0–52.0)
Hemoglobin: 8.4 g/dL — ABNORMAL LOW (ref 13.0–17.0)
MCH: 28.8 pg (ref 26.0–34.0)
MCHC: 30.8 g/dL (ref 30.0–36.0)
MCV: 93.5 fL (ref 80.0–100.0)
Platelets: 211 10*3/uL (ref 150–400)
RBC: 2.92 MIL/uL — ABNORMAL LOW (ref 4.22–5.81)
RDW: 15.1 % (ref 11.5–15.5)
WBC: 6.2 10*3/uL (ref 4.0–10.5)
nRBC: 0 % (ref 0.0–0.2)

## 2022-02-28 LAB — BASIC METABOLIC PANEL
Anion gap: 8 (ref 5–15)
BUN: 29 mg/dL — ABNORMAL HIGH (ref 8–23)
CO2: 23 mmol/L (ref 22–32)
Calcium: 8.9 mg/dL (ref 8.9–10.3)
Chloride: 104 mmol/L (ref 98–111)
Creatinine, Ser: 1.41 mg/dL — ABNORMAL HIGH (ref 0.61–1.24)
GFR, Estimated: 50 mL/min — ABNORMAL LOW (ref >60)
Glucose, Bld: 93 mg/dL (ref 70–99)
Potassium: 4.4 mmol/L (ref 3.5–5.1)
Sodium: 135 mmol/L (ref 135–145)

## 2022-02-28 LAB — BPAM RBC
Blood Product Expiration Date: 202312272359
Blood Product Expiration Date: 202312282359
ISSUE DATE / TIME: 202312042302
ISSUE DATE / TIME: 202312042321
Unit Type and Rh: 5100
Unit Type and Rh: 6200

## 2022-02-28 LAB — TYPE AND SCREEN
ABO/RH(D): A POS
Antibody Screen: NEGATIVE
Unit division: 0
Unit division: 0

## 2022-02-28 LAB — SURGICAL PATHOLOGY

## 2022-02-28 MED ORDER — LACTATED RINGERS IV BOLUS
1000.0000 mL | Freq: Once | INTRAVENOUS | Status: AC
  Administered 2022-02-28: 1000 mL via INTRAVENOUS

## 2022-02-28 MED ORDER — FLUOXETINE HCL 20 MG PO CAPS
20.0000 mg | ORAL_CAPSULE | Freq: Every day | ORAL | Status: DC
  Administered 2022-02-28 – 2022-03-07 (×8): 20 mg via ORAL
  Filled 2022-02-28 (×8): qty 1

## 2022-02-28 MED ORDER — MOMETASONE FURO-FORMOTEROL FUM 200-5 MCG/ACT IN AERO
2.0000 | INHALATION_SPRAY | Freq: Two times a day (BID) | RESPIRATORY_TRACT | Status: DC
  Administered 2022-03-01 – 2022-03-07 (×11): 2 via RESPIRATORY_TRACT
  Filled 2022-02-28: qty 8.8

## 2022-02-28 MED ORDER — LACTATED RINGERS IV SOLN: INTRAVENOUS | Status: DC

## 2022-02-28 MED ORDER — FLUTICASONE PROPIONATE 50 MCG/ACT NA SUSP
2.0000 | Freq: Every day | NASAL | Status: DC
  Administered 2022-03-01 – 2022-03-07 (×5): 2 via NASAL
  Filled 2022-02-28: qty 16

## 2022-02-28 NOTE — Assessment & Plan Note (Signed)
-  creatinine elevated to 1.34 from 1.14 -follow repeat after blood transfusion

## 2022-02-28 NOTE — Progress Notes (Signed)
Chaplain engaged in an initial visit with Mitchell Neal and his oldest daughter.  Chaplain engaged in conversation about advanced care planning, end of life and quality of life.  Mitchell Neal conveyed to [place] and daughter that he is desiring to stop interventions like chemo and that he would like to peacefully begin the process of dying.  Mitchell Neal is interested in talking about hospice care and his next steps as he starts that process.   Mitchell Neal is interested in having conversations about how his pain will be managed.  That seemed to be really important to him as he had some concerns about certain medicines.    Chaplain went over Advanced Directives but found that there is not a pertinent need to get them completed because Mitchell Neal daughters, who are his next-of-kin, work together in providing him care.  Chaplain let them know that we are able to complete them if they decided to do so.  Chaplain assesses that Mitchell Neal more so needs to come up with a plan with a physician to outline his needs for end of life.  His daughter who was present was on board with what her father desires and wants.     02/28/22 1500  Clinical Encounter Type  Visited With Patient and family together  Visit Type Initial;Spiritual support;Social support

## 2022-02-28 NOTE — Progress Notes (Addendum)
PROGRESS NOTE    Mitchell Neal  OJJ:[id number]  DOB: Mar 09, 1941  DOA: 02/27/2022 PCP: Haywood Pao, MD Outpatient Specialists:   Hospital course:  81 year old man was admitted yesterday for increasing shortness of breath and 45 pound weight loss.  He had been recently been treated for anemia by heme-onc.  Workup in ED however revealed bilateral upper lobe lung masses with possible invasion of trachea and chest wall.  Patient declines further workup or biopsy and would like to be referred to hospice.  Subjective:  Patient seen with attentive daughter at bedside.  Patient is very clear that he would not want to have any further workup or biopsy or treatment.  He states he is ready to go onto the next phase what ever that may be.  His main concern is that he will be in pain.  He is also concerned that he has not been restarted on his blood pressure medications in the hospital.  Objective: Vitals:   02/28/22 0735 02/28/22 1100 02/28/22 1400 02/28/22 1701  BP: (!) 126/55 132/62 (!) 125/52 (!) 143/65  Pulse: 60 (!) 58 62 63  Resp: 16 16 18 18   Temp: 98 F (36.7 C) 97.7 F (36.5 C) 97.8 F (36.6 C) (!) 97.5 F (36.4 C)  TempSrc: Oral Oral Oral Oral  SpO2: 100% 99% 100% 100%  Weight:      Height:        Intake/Output Summary (Last 24 hours) at 02/28/2022 1711 Last data filed at 02/28/2022 0218 Gross per 24 hour  Intake 864.5 ml  Output --  Net 864.5 ml   Filed Weights   02/27/22 1523  Weight: 66.6 kg     Exam:  General: Tired appearing gentleman lying in bed in NAD Eyes: sclera anicteric, conjuctiva mild injection bilaterally CVS: S1-S2, regular  Respiratory:  decreased air entry bilaterally secondary to decreased inspiratory effort, rales at bases  GI: NABS, soft, NT  LE: Warm and well-perfused Neuro: A/O x 3,  grossly nonfocal.  Psych: patient is coherent with clear decision making.  He is somewhat rambling in his conversational style.  Scheduled Meds:   FLUoxetine  20 mg Oral Daily   fluticasone  2 spray Each Nare Daily   mometasone-formoterol  2 puff Inhalation BID   Continuous Infusions:  Data Reviewed:  Basic Metabolic Panel: Recent Labs  Lab 02/23/22 0850 02/27/22 1355 02/28/22 0451  NA 137 135 135  K 4.7 4.5 4.4  CL 104 102 104  CO2 27 26 23   GLUCOSE 97 91 93  BUN 32* 29* 29*  CREATININE 1.37* 1.34* 1.41*  CALCIUM 9.7 9.5 8.9    CBC: Recent Labs  Lab 02/23/22 0850 02/27/22 1355 02/28/22 0451  WBC 7.3 6.7 6.2  NEUTROABS 5.1 4.6  --   HGB 7.4* 7.1* 8.4*  HCT 23.9* 22.9* 27.3*  MCV 93.4 93.9 93.5  PLT 194 214 211     Assessment & Plan:   Lung mass, likely malignancy, declining workup and treatment Discussed at length with the patient and his daughter, as did Dr. Tamala Julian from PCCM Both patient and daughter are clear that patient would like to be referred to hospice for symptomatic and comfort care. Discussed with palliative medicine, they will consult on the patient tomorrow. Also discussed with patient's oncologist Dr. Chryl Heck, she will try to come see the patient today.  AKI Patient has been on multiple BP medications as well as Lasix despite significant weight loss He also has decreased p.o. intake secondary  to anorexia Will hydrate patient with LR bolus and then 125 cc an hour overnight Recheck creatinine in the morning  HTN Patient is normotensive off of all of his hypertensive medications Likely secondary to 45 pound weight loss Patient and daughter will benefit from reviewing reasons for discontinuing his BP meds  Anemia Patient received 1 unit PRBC yesterday with increase in hemoglobin from 7.1 to 8.4  COPD Continue maintenance inhalers, no evidence for acute flare    DVT prophylaxis: SCD Code Status: DNR Family Communication: Daughter was at bedside throughout Disposition Plan:   Patient is from: Home  Anticipated Discharge Location: Home  Barriers to Discharge: Awaiting further  discussion with palliative care and disposition plan   Studies: CT Angio Chest PE W/Cm &/Or Wo Cm  Result Date: 02/27/2022 CLINICAL DATA:  Shortness of breath.  Concern for malignancy. EXAM: CT ANGIOGRAPHY CHEST WITH CONTRAST TECHNIQUE: Multidetector CT imaging of the chest was performed using the standard protocol during bolus administration of intravenous contrast. Multiplanar CT image reconstructions and MIPs were obtained to evaluate the vascular anatomy. RADIATION DOSE REDUCTION: This exam was performed according to the departmental dose-optimization program which includes automated exposure control, adjustment of the mA and/or kV according to patient size and/or use of iterative reconstruction technique. CONTRAST:  125mL OMNIPAQUE IOHEXOL 350 MG/ML SOLN COMPARISON:  Earlier radiograph dated 02/27/2022. FINDINGS: Cardiovascular: There is no cardiomegaly. Pericardial effusion measures 9 mm in thickness anterior to the heart. Advanced atherosclerotic calcification of the LAD and left coronary artery. Moderate atherosclerotic calcification of the thoracic aorta. No aneurysmal dilatation or dissection. Evaluation of the pulmonary arteries is limited due to respiratory motion. No pulmonary artery embolus identified. Mediastinum/Nodes: Subcarinal adenopathy measures 3 cm in short axis. Right hilar adenopathy measures 14 mm in short axis. There is infiltrative mass in the right upper mediastinum and paratracheal region measures approximately 5.5 x 8.2 cm in axial dimensions. The mass appears to abut the trachea with probable invasion of the right lateral tracheal wall. The mass also abuts the right lateral aspect of the aortic arch. Mildly rounded bilateral supraclavicular lymph nodes. Lungs/Pleura: Left upper lobe mass abuts the fissure and left lateral pleural surface and extends into the left hilum. This mass measures approximately 6 x 5 cm in axial dimensions. Right upper lobe spiculated mass measures 3.7 x  4.6 cm and contiguous with the described mediastinal infiltrative mass. No pleural effusion or pneumothorax. The central airways remain patent. Upper Abdomen: Gallstones. Indeterminate 3 cm splenic hypodense lesion. Musculoskeletal: Subcentimeter sclerotic focus involving T1 vertebra, nonspecific. No acute osseous pathology. Review of the MIP images confirms the above findings. IMPRESSION: 1. No CT evidence of pulmonary artery embolus. 2. 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 There is probable invasion of the mass into the right tracheal wall. 3. Mediastinal and right hilar adenopathy. 4. Cholelithiasis. 5. Indeterminate 3 cm splenic hypodense lesion. 6. Advanced atherosclerotic calcification of the LAD and left coronary artery. 7.  Aortic Atherosclerosis (ICD10-I70.0). Electronically Signed   By: Anner Crete M.D.   On: 02/27/2022 18:44   DG Chest Port 1 View  Result Date: 02/27/2022 CLINICAL DATA:  Shortness of breath. EXAM: PORTABLE CHEST 1 VIEW COMPARISON:  Chest radiograph dated 06/23/2011. FINDINGS: Rounded opacity in the left upper lung field measures approximately 7 x 7 cm and may represent pneumonia with concerning for a mass. Further evaluation with CT is recommended. Additional rounded density in the right upper lobe measures 3  x 5 cm. There is no pleural effusion or pneumothorax. The cardiac silhouette is within normal limits. There is thickening of the right paratracheal soft tissues which may represent adenopathy or infiltrative mass. No acute osseous pathology. IMPRESSION: Bilateral upper lobe opacities concerning for neoplasm. Further evaluation with CT is recommended. Electronically Signed   By: Anner Crete M.D.   On: 02/27/2022 17:57    Principal Problem:   Symptomatic anemia Active Problems:   Lung mass   COPD with asthma   Essential hypertension   Mediastinal mass   AKI (acute kidney injury)  ([place])     Lilley Hubble Derek Jack, [facility]  If 7PM-7AM, please contact night-coverage [email or web address]   LOS: 0 days

## 2022-02-28 NOTE — Consult Note (Signed)
NAME:  Mitchell Neal, MRN:  [id number], DOB:  1940/06/19, LOS: 0 ADMISSION DATE:  02/27/2022, CONSULTATION DATE:  02/28/22 REFERRING MD:  Jamse Arn, CHIEF COMPLAINT:  SOB   History of Present Illness:  This is an 81 year old man with past medical history of smoking is presenting with 4 to 5 months of extensive weight loss, progressive shortness of breath.  She has been worked up for anemia by hematology.  Unfortunately imaging revealed lung masses for which she was sent to the emergency department.  In the emergency department, CT confirms widely metastatic cancer.  Pulmonary is consulted for consideration of biopsy.  Review of systems as below.  Daughter at bedside.  Pertinent  Medical History  COPD HTN CKD  Significant Hospital Events: Including procedures, antibiotic start and stop dates in addition to other pertinent events   02/28/22  Interim History / Subjective:  Consult  Objective   Blood pressure (!) 125/52, pulse 62, temperature 97.7 F (36.5 C), temperature source Oral, resp. rate 18, height 5\' 9"  (1.753 m), weight 66.6 kg, SpO2 100 %.        Intake/Output Summary (Last 24 hours) at 02/28/2022 1442 Last data filed at 02/28/2022 0218 Gross per 24 hour  Intake 864.5 ml  Output --  Net 864.5 ml   Filed Weights   02/27/22 1523  Weight: 66.6 kg    Examination: General: No acute distress HENT: Mucous membranes moist, temporal wasting noted Lungs: Severely diminished bilaterally, comfortable on room air Cardiovascular: Heart sounds regular, extremities: Abdomen: Soft, positive bowel sounds Extremities: Mild muscle wasting, no edema Neuro: Moves all 4 extremities to command Skin: Scattered bruising from recent falls Psych well he is alert and oriented, he does go on high tangents and repeat himself, question mild cognitive decline  CTA personally reviewed reveals bilateral upper lobe pulmonary masses as well as a mediastinal tumor that is encroaching on the trachea.   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  Had a long discussion with daughter and patient regarding biopsy, treatment modalities, and prognosis for this advanced of cancer.  Patient is fairly adamant that he wants to focus on comfort.  I advised him that in the context of his imaging and clinical history with rapid progression of symptoms that this is reasonable.  They are going to talk to the oncologist as well as palliative care team.  I have placed information in the discharge paperwork from our clinic should they decide to pursue biopsy.  Daughter is interested in inpatient hospice as the patient has been displaying signs of being unable to care for self, recent falls and forgetfulness.  Consider MRI brain to look for mets which could be complicating picture.  Available PRN  Best Practice (right click and "Reselect all SmartList Selections" daily)   Per primary  Labs   CBC: Recent Labs  Lab 02/23/22 0850 02/27/22 1355 02/28/22 0451  WBC 7.3 6.7 6.2  NEUTROABS 5.1 4.6  --   HGB 7.4* 7.1* 8.4*  HCT 23.9* 22.9* 27.3*  MCV 93.4 93.9 93.5  PLT 194 214 371    Basic Metabolic Panel: Recent Labs  Lab 02/23/22 0850 02/27/22 1355 02/28/22 0451  NA 137 135 135  K 4.7 4.5 4.4  CL 104 102 104  CO2 27 26 23   GLUCOSE 97 91 93  BUN 32* 29* 29*  CREATININE 1.37* 1.34* 1.41*  CALCIUM 9.7  9.5 8.9   GFR: Estimated Creatinine Clearance: 38.7 mL/min (A) (by C-G formula based on SCr of 1.41 mg/dL (H)). Recent Labs  Lab 02/23/22 0850 02/27/22 1355 02/27/22 1610 02/28/22 0451  WBC 7.3 6.7  --  6.2  LATICACIDVEN  --   --  1.2  --     Liver Function Tests: Recent Labs  Lab 02/23/22 0850 02/27/22 1355  AST 14* 15  ALT 11 11  ALKPHOS 82 75  BILITOT 0.7 0.6  PROT 7.9 8.1  ALBUMIN 3.1* 3.2*   No results for input(s): "LIPASE", "AMYLASE" in the last 168 hours. No  results for input(s): "AMMONIA" in the last 168 hours.  ABG No results found for: "PHART", "PCO2ART", "PO2ART", "HCO3", "TCO2", "ACIDBASEDEF", "O2SAT"   Coagulation Profile: Recent Labs  Lab 02/27/22 1610  INR 1.4*    Cardiac Enzymes: No results for input(s): "CKTOTAL", "CKMB", "CKMBINDEX", "TROPONINI" in the last 168 hours.  HbA1C: No results found for: "HGBA1C"  CBG: No results for input(s): "GLUCAP" in the last 168 hours.  Review of Systems:    Positive Symptoms in bold:  Constitutional fevers, chills, weight loss, fatigue, anorexia, malaise  Eyes decreased vision, double vision, eye irritation  Ears, Nose, Mouth, Throat sore throat, trouble swallowing, sinus congestion  Cardiovascular chest pain, paroxysmal nocturnal dyspnea, lower ext edema, palpitations   Respiratory SOB, cough, DOE, hemoptysis, wheezing  Gastrointestinal nausea, vomiting, diarrhea  Genitourinary burning with urination, trouble urinating  Musculoskeletal joint aches, joint swelling, back pain  Integumentary  rashes, skin lesions  Neurological focal weakness, focal numbness, trouble speaking, headaches  Psychiatric depression, anxiety, confusion  Endocrine polyuria, polydipsia, cold intolerance, heat intolerance  Hematologic abnormal bruising, abnormal bleeding, unexplained nose bleeds  Allergic/Immunologic recurrent infections, hives, swollen lymph nodes     Past Medical History:  He,  has a past medical history of Allergy, Anxiety, Arthritis, Asthma, CKD (chronic kidney disease), COPD (chronic obstructive pulmonary disease) ([place]), Depression, ERECTILE DYSFUNCTION, 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   Surgical History:   Past Surgical History:  Procedure Laterality Date   COLONOSCOPY     polyps   POLYPECTOMY     neck -    TONSILLECTOMY       Social History:   reports that he quit smoking about 11 years ago. His smoking use included  cigarettes. He has a 12.50 pack-year smoking history. He has never used smokeless tobacco. He reports that he does not drink alcohol and does not use drugs.   Family History:  His family history includes Heart disease in his sister; Hypertension in an other family member. There is no history of Colon cancer, Colon polyps, Esophageal cancer, Rectal cancer, or Stomach cancer.   Allergies Allergies  Allergen Reactions   Prednisolone     unknown     Home Medications  Prior to Admission medications   Medication Sig Start Date End Date Taking? Authorizing Provider  ADVAIR DISKUS 250-50 MCG/DOSE AEPB INHALE ONE DOSE BY MOUTH TWICE DAILY Patient taking differently: Inhale 1 puff into the lungs in the morning and at bedtime. 05/29/12  Yes Dorena Cookey, MD  albuterol (PROVENTIL HFA;VENTOLIN HFA) 108 (90 BASE) MCG/ACT inhaler One puffs 3 times daily Patient taking differently: Inhale 2 puffs into the lungs every 6 (six) hours as needed for shortness of breath. 06/12/11  Yes Marletta Lor, MD  Cyanocobalamin (VITAMIN B-12 PO) Take 1 tablet by mouth daily.   Yes [provider]  FLUoxetine (PROZAC) 20 MG capsule  Take 20 mg by mouth daily.   Yes [provider]  fluticasone (FLONASE) 50 MCG/ACT nasal spray Place 2 sprays into both nostrils daily.   Yes [provider]  furosemide (LASIX) 20 MG tablet Take 1 tablet by mouth daily. 01/05/22  Yes [provider]  labetalol (NORMODYNE) 200 MG tablet Take 300 mg by mouth 2 (two) times daily.   Yes [provider]  potassium chloride (KLOR-CON) 10 MEQ tablet Take 10 mEq by mouth daily. 01/05/22  Yes [provider]  valsartan (DIOVAN) 160 MG tablet Take 1 tablet by mouth daily. 12/25/19  Yes [provider]  labetalol (NORMODYNE) 300 MG tablet take 1 tablet by mouth twice a day Patient not taking: Reported on 02/27/2022 01/30/12   Dorena Cookey, MD  losartan (COZAAR) 50 MG tablet Take 1  tablet (50 mg total) by mouth 2 (two) times daily. Patient not taking: Reported on 02/27/2022 05/26/11   Dorena Cookey, MD     Critical care time: N/A

## 2022-02-28 NOTE — Assessment & Plan Note (Signed)
-  not in exacerbation -continue home bronchodilator

## 2022-02-28 NOTE — Progress Notes (Signed)
Pt's daughter has concerns surrounding this patients safety if he returns home alone. She would like to have the social worker give her a call at [place] (Daughter) [phone]

## 2022-02-28 NOTE — Assessment & Plan Note (Signed)
-  holding antihypertensives overnight due to hypovolemia

## 2022-02-28 NOTE — Plan of Care (Signed)

## 2022-02-28 NOTE — Assessment & Plan Note (Signed)
-  Hgb of 7.1 on presentation and symptomatic. -Has been following hematology and recently underwent bone marrow biopsy on 11/30 but now anemia is likely due to his new lung malignancy -transfuse 1u pRBC and follow repeat Hgb in the morning -repeat transfusion threshold Hgb <7

## 2022-02-28 NOTE — Assessment & Plan Note (Addendum)
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 Would like to be DNR status.

## 2022-03-01 ENCOUNTER — Encounter (HOSPITAL_COMMUNITY): Payer: Self-pay | Admitting: Hematology and Oncology

## 2022-03-01 DIAGNOSIS — Z515 Encounter for palliative care: Secondary | ICD-10-CM | POA: Diagnosis not present

## 2022-03-01 DIAGNOSIS — R52 Pain, unspecified: Secondary | ICD-10-CM

## 2022-03-01 DIAGNOSIS — Z7189 Other specified counseling: Secondary | ICD-10-CM

## 2022-03-01 DIAGNOSIS — N179 Acute kidney failure, unspecified: Secondary | ICD-10-CM | POA: Diagnosis not present

## 2022-03-01 DIAGNOSIS — R918 Other nonspecific abnormal finding of lung field: Secondary | ICD-10-CM | POA: Diagnosis not present

## 2022-03-01 DIAGNOSIS — J9859 Other diseases of mediastinum, not elsewhere classified: Secondary | ICD-10-CM | POA: Diagnosis not present

## 2022-03-01 DIAGNOSIS — R0600 Dyspnea, unspecified: Secondary | ICD-10-CM

## 2022-03-01 DIAGNOSIS — Z79899 Other long term (current) drug therapy: Secondary | ICD-10-CM

## 2022-03-01 DIAGNOSIS — R451 Restlessness and agitation: Secondary | ICD-10-CM

## 2022-03-01 DIAGNOSIS — J4489 Other specified chronic obstructive pulmonary disease: Secondary | ICD-10-CM | POA: Diagnosis not present

## 2022-03-01 DIAGNOSIS — D649 Anemia, unspecified: Secondary | ICD-10-CM | POA: Diagnosis not present

## 2022-03-01 LAB — CBC
HCT: 27.4 % — ABNORMAL LOW (ref 39.0–52.0)
Hemoglobin: 8.4 g/dL — ABNORMAL LOW (ref 13.0–17.0)
MCH: 28.7 pg (ref 26.0–34.0)
MCHC: 30.7 g/dL (ref 30.0–36.0)
MCV: 93.5 fL (ref 80.0–100.0)
Platelets: 183 10*3/uL (ref 150–400)
RBC: 2.93 MIL/uL — ABNORMAL LOW (ref 4.22–5.81)
RDW: 14.6 % (ref 11.5–15.5)
WBC: 5.2 10*3/uL (ref 4.0–10.5)
nRBC: 0 % (ref 0.0–0.2)

## 2022-03-01 LAB — BASIC METABOLIC PANEL
Anion gap: 9 (ref 5–15)
BUN: 23 mg/dL (ref 8–23)
CO2: 23 mmol/L (ref 22–32)
Calcium: 8.9 mg/dL (ref 8.9–10.3)
Chloride: 104 mmol/L (ref 98–111)
Creatinine, Ser: 1.18 mg/dL (ref 0.61–1.24)
GFR, Estimated: >60 mL/min (ref >60)
Glucose, Bld: 86 mg/dL (ref 70–99)
Potassium: 4.4 mmol/L (ref 3.5–5.1)
Sodium: 136 mmol/L (ref 135–145)

## 2022-03-01 MED ORDER — OXYCODONE HCL 5 MG PO TABS: 2.5000 mg | ORAL_TABLET | ORAL | Status: DC | PRN

## 2022-03-01 MED ORDER — HALOPERIDOL LACTATE 5 MG/ML IJ SOLN: 0.5000 mg | INTRAMUSCULAR | Status: DC | PRN

## 2022-03-01 MED ORDER — ONDANSETRON 4 MG PO TBDP: 4.0000 mg | ORAL_TABLET | Freq: Four times a day (QID) | ORAL | Status: DC | PRN

## 2022-03-01 MED ORDER — LORAZEPAM 2 MG/ML IJ SOLN: 1.0000 mg | INTRAMUSCULAR | Status: DC | PRN

## 2022-03-01 MED ORDER — ACETAMINOPHEN 500 MG PO TABS
1000.0000 mg | ORAL_TABLET | Freq: Three times a day (TID) | ORAL | Status: DC
  Administered 2022-03-01 – 2022-03-05 (×8): 1000 mg via ORAL
  Filled 2022-03-01 (×11): qty 2

## 2022-03-01 MED ORDER — LACTATED RINGERS IV SOLN: INTRAVENOUS | Status: DC

## 2022-03-01 MED ORDER — POLYVINYL ALCOHOL 1.4 % OP SOLN: 1.0000 [drp] | Freq: Four times a day (QID) | OPHTHALMIC | Status: DC | PRN

## 2022-03-01 MED ORDER — HALOPERIDOL LACTATE 2 MG/ML PO CONC: 0.5000 mg | ORAL | Status: DC | PRN

## 2022-03-01 MED ORDER — LORAZEPAM 2 MG/ML PO CONC: 1.0000 mg | ORAL | Status: DC | PRN

## 2022-03-01 MED ORDER — HYDROMORPHONE HCL 1 MG/ML IJ SOLN: 0.2000 mg | INTRAMUSCULAR | Status: DC | PRN

## 2022-03-01 MED ORDER — ONDANSETRON HCL 4 MG/2ML IJ SOLN: 4.0000 mg | Freq: Four times a day (QID) | INTRAMUSCULAR | Status: DC | PRN

## 2022-03-01 MED ORDER — LORAZEPAM 1 MG PO TABS: 1.0000 mg | ORAL_TABLET | ORAL | Status: DC | PRN

## 2022-03-01 MED ORDER — BIOTENE DRY MOUTH MT LIQD: 15.0000 mL | OROMUCOSAL | Status: DC | PRN

## 2022-03-01 MED ORDER — HALOPERIDOL 0.5 MG PO TABS: 0.5000 mg | ORAL_TABLET | ORAL | Status: DC | PRN

## 2022-03-01 NOTE — Progress Notes (Signed)
SATURATION QUALIFICATIONS: (This note is used to comply with regulatory documentation for home oxygen)  Patient Saturations on Room Air at Rest = 97%  Patient Saturations on Room Air while Ambulating = 83%

## 2022-03-01 NOTE — Progress Notes (Signed)
Mobility Specialist - Progress Note   03/01/22 0956  Mobility  Activity Ambulated with assistance to bathroom  Level of Assistance Standby assist, set-up cues, supervision of patient - no hands on  Assistive Device Front wheel walker  Distance Ambulated (ft) 25 ft  Activity Response Tolerated well  Mobility Referral Yes  $Mobility charge 1 Mobility   Pt received in bed and agreeable to mobility. Pt requested assistance to bathroom for BM, upon return to room pt mentioned feeling SOB. O2 was checked and was 83%. Nurse notified. Pt back to bed & O2 came back up to 99%. No other complaints during session. Pt to bed after session with all needs met & nurse in room.    During mobility: 83% SpO2 Post-mobility: 77bpm HR, 147/63 BP, 99% SPO2  Set designer

## 2022-03-01 NOTE — Progress Notes (Signed)
PROGRESS NOTE    Mitchell Neal  TKW:[id number]  DOB: 05-13-1940  DOA: 02/27/2022 PCP: Haywood Pao, MD    Hospital course: 81 year old man was admitted for increasing shortness of breath and 45 pound weight loss.  He had been recently been treated for anemia by heme-onc.  Workup in ED however revealed bilateral upper lobe lung masses with possible invasion of trachea and chest wall.  Patient declines further workup or biopsy and would like to be referred to hospice.  Palliative and oncology consulted.    Subjective: Today, had extensive conversation with patient and daughter at bedside, adamantly wants no more workup, wants to be comfortable.  Discussions with palliative and daughters were held and patient switched to comfort care.  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 Adult failure to thrive Declining workup and treatment, switch to comfort care on 03/01/2022 Palliative care on board Oncology consulted Comfort care orders placed  AKI Resolved s/p IV fluid  HTN Patient is normotensive off of all of his hypertensive medications Likely secondary to 45 pound weight loss Discontinued all BP meds  Normocytic anemia Anemia of chronic disease Hemoglobin stable Patient received 1 unit of PRBC on 02/27/2022  COPD Stable  Goals of care discussion Patient is now comfort care Social work on board for safe discharge plans, may require long-term care with hospice services   Objective: Vitals:   02/28/22 2115 03/01/22 0102 03/01/22 0536 03/01/22 1404  BP: (!) 145/64 (!) 144/61 (!) 143/65 (!) 151/65  Pulse: 62 73 70 63  Resp: 17 17 17 17    Temp: (!) 97.4 F (36.3 C) (!) 97.3 F (36.3 C) (!) 97.4 F (36.3 C) 97.9 F (36.6 C)  TempSrc:    Oral  SpO2: 96% 95% 95% 99%  Weight:      Height:        Intake/Output Summary (Last 24 hours) at 03/01/2022 1728 Last data filed at 03/01/2022 1552 Gross per 24 hour  Intake 578.89 ml  Output --  Net 578.89 ml   Filed Weights   02/27/22 1523  Weight: 66.6 kg     Exam: General: NAD, cachectic, chronically ill-appearing, intermittently confused Cardiovascular: S1, S2 present Respiratory: CTAB Abdomen: Soft, nontender, nondistended, bowel sounds present Musculoskeletal: No bilateral pedal edema noted Skin: Normal Psychiatry: Fair mood   Scheduled Meds:  acetaminophen  1,000 mg Oral Q8H   FLUoxetine  20 mg Oral Daily   fluticasone  2 spray Each Nare Daily   mometasone-formoterol  2 puff Inhalation BID   Continuous Infusions:  Data Reviewed:  Basic Metabolic Panel: Recent Labs  Lab 02/23/22 0850 02/27/22 1355 02/28/22 0451 03/01/22 0557  NA 137 135 135 136  K 4.7 4.5 4.4 4.4  CL 104 102 104 104  CO2 27 26 23 23   GLUCOSE 97 91 93 86  BUN 32* 29* 29* 23  CREATININE 1.37* 1.34* 1.41* 1.18  CALCIUM 9.7 9.5 8.9 8.9    CBC: Recent Labs  Lab 02/23/22 0850 02/27/22 1355 02/28/22 0451 03/01/22 0557  WBC 7.3 6.7 6.2 5.2  NEUTROABS 5.1 4.6  --   --   HGB  7.4* 7.1* 8.4* 8.4*  HCT 23.9* 22.9* 27.3* 27.4*  MCV 93.4 93.9 93.5 93.5  PLT 194 214 211 183        DVT prophylaxis: None Code Status: DNR Family Communication: Daughter was at bedside throughout Disposition Plan:   Patient is from: Home  Anticipated Discharge Location: Long-term residential  Barriers to Discharge: Safe discharge plan    Studies: CT Angio Chest PE W/Cm &/Or Wo Cm  Result Date: 02/27/2022 CLINICAL DATA:  Shortness of breath.  Concern for malignancy. EXAM: CT ANGIOGRAPHY CHEST WITH CONTRAST TECHNIQUE: Multidetector CT imaging of the chest was performed using the standard  protocol during bolus administration of intravenous contrast. Multiplanar CT image reconstructions and MIPs were obtained to evaluate the vascular anatomy. RADIATION DOSE REDUCTION: This exam was performed according to the departmental dose-optimization program which includes automated exposure control, adjustment of the mA and/or kV according to patient size and/or use of iterative reconstruction technique. CONTRAST:  110mL OMNIPAQUE IOHEXOL 350 MG/ML SOLN COMPARISON:  Earlier radiograph dated 02/27/2022. FINDINGS: Cardiovascular: There is no cardiomegaly. Pericardial effusion measures 9 mm in thickness anterior to the heart. Advanced atherosclerotic calcification of the LAD and left coronary artery. Moderate atherosclerotic calcification of the thoracic aorta. No aneurysmal dilatation or dissection. Evaluation of the pulmonary arteries is limited due to respiratory motion. No pulmonary artery embolus identified. Mediastinum/Nodes: Subcarinal adenopathy measures 3 cm in short axis. Right hilar adenopathy measures 14 mm in short axis. There is infiltrative mass in the right upper mediastinum and paratracheal region measures approximately 5.5 x 8.2 cm in axial dimensions. The mass appears to abut the trachea with probable invasion of the right lateral tracheal wall. The mass also abuts the right lateral aspect of the aortic arch. Mildly rounded bilateral supraclavicular lymph nodes. Lungs/Pleura: Left upper lobe mass abuts the fissure and left lateral pleural surface and extends into the left hilum. This mass measures approximately 6 x 5 cm in axial dimensions. Right upper lobe spiculated mass measures 3.7 x 4.6 cm and contiguous with the described mediastinal infiltrative mass. No pleural effusion or pneumothorax. The central airways remain patent. Upper Abdomen: Gallstones. Indeterminate 3 cm splenic hypodense lesion. Musculoskeletal: Subcentimeter sclerotic focus involving T1 vertebra, nonspecific. No acute  osseous pathology. Review of the MIP images confirms the above findings. IMPRESSION: 1. No CT evidence of pulmonary artery embolus. 2. 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 There is probable invasion of the mass into the right tracheal wall. 3. Mediastinal and right hilar adenopathy. 4. Cholelithiasis. 5. Indeterminate 3 cm splenic hypodense lesion. 6. Advanced atherosclerotic calcification of the LAD and left coronary artery. 7.  Aortic Atherosclerosis (ICD10-I70.0). Electronically Signed   By: Anner Crete M.D.   On: 02/27/2022 18:44   DG Chest Port 1 View  Result Date: 02/27/2022 CLINICAL DATA:  Shortness of breath. EXAM: PORTABLE CHEST 1 VIEW COMPARISON:  Chest radiograph dated 06/23/2011. FINDINGS: Rounded opacity in the left upper lung field measures approximately 7 x 7 cm and may represent pneumonia with concerning for a mass. Further evaluation with CT is recommended. Additional rounded density in the right upper lobe measures 3 x 5 cm. There is no pleural effusion or pneumothorax. The cardiac silhouette is within normal limits. There is thickening of the right paratracheal soft tissues which may represent adenopathy or infiltrative mass. No acute osseous pathology. IMPRESSION: Bilateral upper lobe opacities concerning for neoplasm. Further evaluation with CT is recommended. Electronically Signed   By:  Anner Crete M.D.   On: 02/27/2022 17:57        [place] Curlie Macken,MD [facility]  If 7PM-7AM, please contact night-coverage [email or web address]   LOS: 0 days

## 2022-03-01 NOTE — Progress Notes (Signed)
[facility] CONSULT NOTE  Patient Care Team: Tisovec, [name], MD as PCP - General (Internal Medicine)  CHIEF COMPLAINTS/PURPOSE OF CONSULTATION:  Normocytic normochromic anemia, now diagnosed with bilateral lung masses concerning for lung primary malignancy  ASSESSMENT & PLAN:   This is a pleasant 81 year old male patient with worsening anemia, normocytic normochromic referred to hematology for additional recommendations.  He was initially referred to hematology for anemia evaluation.  We did investigation which showed mild B12 deficiency, mild hypothyroidism but no overt explanation for his worsening anemia hence we have proceeded with bone marrow aspiration and biopsy.  BBMB with hypercellularity, no other evidence of malignancy.  In the interim, he continued to lose weight, hence his daughter brought him for follow up He was noticed to be very fatigued, cachectic and Hb of 7. She was very worried about leaving him at home and was hoping to be admitted for further investigation In the ED, he had a CT imaging which showed bilateral upper lobe masses, hilar and mediastinal adenopathy cocnerning for lung primary. We may need a biopsy to review exact treatment recommendations and prognosis.   However patient and daughter are very clear, he doesn't want to proceed with biopsy and treatment, he would like to stay comfortable and want to proceed with hospice. It looks like family is not comfortable with him going home with home hospice since they are worried he might die alone and would like him to go to facility where he can be monitored. Primary team and SW working on this.  Without treatment, his prognosis is likely in the order of months. If this is small cell, it could be in the order of several weeks to few months with out treatment.  I will follow up with him as needed at this point. He and his daughter were strongly encouraged to reach out to me Thank you for consulting  [place] the care of this patient.  Please not hesitate to contact us with any additional questions or concerns.  HISTORY OF PRESENTING ILLNESS:  Mitchell Neal 81 y.o. male is here because of normocytic normochromic anemia.  Mitchell Neal is an 81 year old male patient with past medical history significant for COPD, chronic kidney disease referred to hematology for evaluation of anemia.   No evidence of nutritional deficiency except for B12 deficiency, M protein, hemolysis.   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  He was started on B 12 supplementation but continued to develop progressive anemia and rapid weight loss. We agreed to proceed with BMB. He had BMB on 11/30. In the past when we asked him to consider imaging, he was reluctant.    He is now admitted with failure to thrive. Today he says he is at peace with this imaging findings and he doesn't want any more procedures or treatment. He was a heavy smoker from age of [age], smoked 1 PPD. 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   Hearing loss    no hearing aids   HYPERTENSION 10/07/2009   Kidney stones    passed stones, no surgery required    SURGICAL HISTORY: Past Surgical History:  Procedure Laterality Date   COLONOSCOPY     polyps  POLYPECTOMY     neck -    TONSILLECTOMY      SOCIAL HISTORY: Social History   Socioeconomic History   Marital status: Widowed    Spouse name: Not on file   Number of children: 2   Years of education: Not on file   Highest education level: Not on file  Occupational History   Not on file  Tobacco Use   Smoking status: Former    Packs/day: 0.50    Years: 25.00    Total pack years: 12.50    Types: Cigarettes    Quit date: 08/26/2010    Years since quitting:  11.5   Smokeless tobacco: Never  Vaping Use   Vaping Use: Never used  Substance and Sexual Activity   Alcohol use: No   Drug use: No   Sexual activity: Not on file  Other Topics Concern   Not on file  Social History Narrative   Not on file   Social Determinants of Health   Financial Resource Strain: Not on file  Food Insecurity: No Food Insecurity (02/28/2022)   Hunger Vital Sign    Worried About Running Out of Food in the Last Year: Never true    [place] in the Last Year: Never true  Transportation Needs: No Transportation Needs (02/28/2022)   PRAPARE - Hydrologist (Medical): No    Lack of Transportation (Non-Medical): No  Physical Activity: Not on file  Stress: Not on file  Social Connections: Not on file  Intimate Partner Violence: Not At Risk (02/28/2022)   Humiliation, Afraid, Rape, and Kick questionnaire    Fear of Current or Ex-Partner: No    Emotionally Abused: No    Physically Abused: No    Sexually Abused: No    FAMILY HISTORY: Family History  Problem Relation Age of Onset   Heart disease Sister    Hypertension Other    Colon cancer Neg Hx    Colon polyps Neg Hx    Esophageal cancer Neg Hx    Rectal cancer Neg Hx    Stomach cancer Neg Hx     ALLERGIES:  is allergic to prednisolone.  MEDICATIONS:  Current Facility-Administered Medications  Medication Dose Route Frequency Provider Last Rate Last Admin   acetaminophen (TYLENOL) tablet 1,000 mg  1,000 mg Oral Q8H Mims, Lauren W, DO   1,000 mg at 03/01/22 1451   antiseptic oral rinse (BIOTENE) solution 15 mL  15 mL Topical PRN Mims, Lauren W, DO       FLUoxetine (PROZAC) capsule 20 mg  20 mg Oral Daily Tu, Ching T, DO   20 mg at 03/01/22 0907   fluticasone (FLONASE) 50 MCG/ACT nasal spray 2 spray  2 spray Each Nare Daily Tu, Ching T, DO   2 spray at 03/01/22 0908   haloperidol (HALDOL) tablet 0.5 mg  0.5 mg Oral Q4H PRN Terrilee Files, DO       Or   haloperidol (HALDOL)  2 MG/ML solution 0.5 mg  0.5 mg Sublingual Q4H PRN Mims, Lauren W, DO       Or   haloperidol lactate (HALDOL) injection 0.5 mg  0.5 mg Intravenous Q4H PRN Mims, Lauren W, DO       HYDROmorphone (DILAUDID) injection 0.2 mg  0.2 mg Intravenous Q2H PRN Mims, Lauren W, DO       LORazepam (ATIVAN) tablet 1 mg  1 mg Oral Q4H PRN Terrilee Files, DO  Or   LORazepam (ATIVAN) 2 MG/ML concentrated solution 1 mg  1 mg Sublingual Q4H PRN Mims, Lauren W, DO       Or   LORazepam (ATIVAN) injection 1 mg  1 mg Intravenous Q4H PRN Mims, Lauren W, DO       mometasone-formoterol (DULERA) 200-5 MCG/ACT inhaler 2 puff  2 puff Inhalation BID Tu, Ching T, DO       ondansetron (ZOFRAN-ODT) disintegrating tablet 4 mg  4 mg Oral Q6H PRN Mims, Lauren W, DO       Or   ondansetron (ZOFRAN) injection 4 mg  4 mg Intravenous Q6H PRN Mims, Lauren W, DO       oxyCODONE (Oxy IR/ROXICODONE) immediate release tablet 2.5 mg  2.5 mg Oral Q4H PRN Mims, Lauren W, DO       polyvinyl alcohol (LIQUIFILM TEARS) 1.4 % ophthalmic solution 1 drop  1 drop Both Eyes QID PRN Mims, Lauren W, DO         PHYSICAL EXAMINATION: ECOG PERFORMANCE STATUS: 0 - Asymptomatic  Vitals:   03/01/22 0536 03/01/22 1404  BP: (!) 143/65 (!) 151/65  Pulse: 70 63  Resp: 17 17  Temp: (!) 97.4 F (36.3 C) 97.9 F (36.6 C)  SpO2: 95% 99%   Filed Weights   02/27/22 1523  Weight: 146 lb 13.2 oz (66.6 kg)   Physical Exam Constitutional:      Appearance: He is ill-appearing.     Comments: He appears very weak but comfortable  Musculoskeletal:        General: No swelling.  Neurological:     Mental Status: He is alert.      LABORATORY DATA:  I have reviewed the data as listed Lab Results  Component Value Date   WBC 5.2 03/01/2022   HGB 8.4 (L) 03/01/2022   HCT 27.4 (L) 03/01/2022   MCV 93.5 03/01/2022   PLT 183 03/01/2022     Chemistry      Component Value Date/Time   NA 136 03/01/2022 0557   K 4.4 03/01/2022 0557   CL 104  03/01/2022 0557   CO2 23 03/01/2022 0557   BUN 23 03/01/2022 0557   CREATININE 1.18 03/01/2022 0557   CREATININE 1.34 (H) 02/27/2022 1355      Component Value Date/Time   CALCIUM 8.9 03/01/2022 0557   ALKPHOS 75 02/27/2022 1355   AST 15 02/27/2022 1355   ALT 11 02/27/2022 1355   BILITOT 0.6 02/27/2022 1355       RADIOGRAPHIC STUDIES: I have personally reviewed the radiological images as listed and agreed with the findings in the report. CT Angio Chest PE W/Cm &/Or Wo Cm  Result Date: 02/27/2022 CLINICAL DATA:  Shortness of breath.  Concern for malignancy. EXAM: CT ANGIOGRAPHY CHEST WITH CONTRAST TECHNIQUE: Multidetector CT imaging of the chest was performed using the standard protocol during bolus administration of intravenous contrast. Multiplanar CT image reconstructions and MIPs were obtained to evaluate the vascular anatomy. RADIATION DOSE REDUCTION: This exam was performed according to the departmental dose-optimization program which includes automated exposure control, adjustment of the mA and/or kV according to patient size and/or use of iterative reconstruction technique. CONTRAST:  164mL OMNIPAQUE IOHEXOL 350 MG/ML SOLN COMPARISON:  Earlier radiograph dated 02/27/2022. FINDINGS: Cardiovascular: There is no cardiomegaly. Pericardial effusion measures 9 mm in thickness anterior to the heart. Advanced atherosclerotic calcification of the LAD and left coronary artery. Moderate atherosclerotic calcification of the thoracic aorta. No aneurysmal dilatation or dissection. Evaluation of the pulmonary  arteries is limited due to respiratory motion. No pulmonary artery embolus identified. Mediastinum/Nodes: Subcarinal adenopathy measures 3 cm in short axis. Right hilar adenopathy measures 14 mm in short axis. There is infiltrative mass in the right upper mediastinum and paratracheal region measures approximately 5.5 x 8.2 cm in axial dimensions. The mass appears to abut the trachea with probable  invasion of the right lateral tracheal wall. The mass also abuts the right lateral aspect of the aortic arch. Mildly rounded bilateral supraclavicular lymph nodes. Lungs/Pleura: Left upper lobe mass abuts the fissure and left lateral pleural surface and extends into the left hilum. This mass measures approximately 6 x 5 cm in axial dimensions. Right upper lobe spiculated mass measures 3.7 x 4.6 cm and contiguous with the described mediastinal infiltrative mass. No pleural effusion or pneumothorax. The central airways remain patent. Upper Abdomen: Gallstones. Indeterminate 3 cm splenic hypodense lesion. Musculoskeletal: Subcentimeter sclerotic focus involving T1 vertebra, nonspecific. No acute osseous pathology. Review of the MIP images confirms the above findings. IMPRESSION: 1. No CT evidence of pulmonary artery embolus. 2. 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 There is probable invasion of the mass into the right tracheal wall. 3. Mediastinal and right hilar adenopathy. 4. Cholelithiasis. 5. Indeterminate 3 cm splenic hypodense lesion. 6. Advanced atherosclerotic calcification of the LAD and left coronary artery. 7.  Aortic Atherosclerosis (ICD10-I70.0). Electronically Signed   By: Anner Crete M.D.   On: 02/27/2022 18:44   DG Chest Port 1 View  Result Date: 02/27/2022 CLINICAL DATA:  Shortness of breath. EXAM: PORTABLE CHEST 1 VIEW COMPARISON:  Chest radiograph dated 06/23/2011. FINDINGS: Rounded opacity in the left upper lung field measures approximately 7 x 7 cm and may represent pneumonia with concerning for a mass. Further evaluation with CT is recommended. Additional rounded density in the right upper lobe measures 3 x 5 cm. There is no pleural effusion or pneumothorax. The cardiac silhouette is within normal limits. There is thickening of the right paratracheal soft tissues which may represent adenopathy or infiltrative mass. No acute  osseous pathology. IMPRESSION: Bilateral upper lobe opacities concerning for neoplasm. Further evaluation with CT is recommended. Electronically Signed   By: Anner Crete M.D.   On: 02/27/2022 17:57    All questions were answered. The patient knows to call the clinic with any problems, questions or concerns. I spent 25 minutes in the care of this patient including H and P, review of records, counseling and coordination of care.     Benay Pike, MD 03/01/2022 4:33 PM

## 2022-03-01 NOTE — TOC Initial Note (Signed)
Transition of Care [facility]) - Initial/Assessment Note    Patient Details  Name: CELEDONIO SORTINO MRN: [id number] Date of Birth: 01/30/1941  Transition of Care [facility]) CM/SW Contact:    Vassie Moselle, LCSW Phone Number: 03/01/2022, 2:38 PM  Clinical Narrative:                 Met with pt and daughters In room to discuss recommendations for hospice. Pt and family in agreeance with Hospice services however, requested to review information for hospice agencies prior to making a decision.  Pt's daughters state his mentation has continued to decline and they do not feel he is able to return home. Daughters state that they are unable to provide 24/7 care for this pt and pt is unable to move in with family. CSW discussed possibility of pt returning home with 24/7 home care through a HHA. Pt's daughters stated they do not think pt should return home at all and are only interested in pt going to a facility.  CSW discussed private costs of pt going to facility in which, pt/daughters share he would be unable to afford. They share pt receives $2,200 a month in social security and less than $20,000 in savings. CSW provided pt's daughters with information for [facility] to assist with long term care placement.  Pt's daughters are planning on reaching out to their attorney's to complete POA paperwork.   Expected Discharge Plan: Home w Hospice Care Barriers to Discharge: No Barriers Identified   Patient Goals and CMS Choice Patient states their goals for this hospitalization and ongoing recovery are:: For pt to go to a facility w/ hospice CMS Medicare.gov Compare Post Acute Care list provided to:: Patient Choice offered to / list presented to : Patient, Adult Children  Expected Discharge Plan and Services Expected Discharge Plan: Home w Hospice Care In-house Referral: Hospice / [place], Chaplain Discharge Planning Services: CM Consult Post Acute Care Choice: Hospice Living arrangements for  the past 2 months: Single Family Home                 DME Arranged: N/A DME Agency: NA                  Prior Living Arrangements/Services Living arrangements for the past 2 months: Single Family Home Lives with:: Self Patient language and need for interpreter reviewed:: Yes Do you feel safe going back to the place where you live?: Yes      Need for Family Participation in Patient Care: Yes (Comment) Care giver support system in place?: No (comment) Current home services: DME Criminal Activity/Legal Involvement Pertinent to Current Situation/Hospitalization: No - Comment as needed  Activities of Daily Living Home Assistive Devices/Equipment: Cane (specify quad or straight) ADL Screening (condition at time of admission) Patient's cognitive ability adequate to safely complete daily activities?: Yes Is the patient deaf or have difficulty hearing?: No Does the patient have difficulty seeing, even when wearing glasses/contacts?: No Does the patient have difficulty concentrating, remembering, or making decisions?: No Patient able to express need for assistance with ADLs?: Yes Does the patient have difficulty dressing or bathing?: No Independently performs ADLs?: Yes (appropriate for developmental age) Does the patient have difficulty walking or climbing stairs?: No Weakness of Legs: None Weakness of Arms/Hands: None  Permission Sought/Granted Permission sought to share information with : Facility Sport and exercise psychologist, Case Manager, Family Supports Permission granted to share information with : Yes, Verbal Permission Granted  Share Information with NAME:  Leigh and Vicente Males     Permission granted to share info w Relationship: Daughters  Permission granted to Contractor Information: [phone] and [phone]  Emotional Assessment Appearance:: Appears stated age Attitude/Demeanor/Rapport: Engaged, Inconsistent Affect (typically observed): Calm Orientation: :  Oriented to Self, Oriented to Place, Oriented to  Time, Oriented to Situation Alcohol / Substance Use: Not Applicable Psych Involvement: No (comment)  Admission diagnosis:  Symptomatic anemia [D64.9] Patient Active Problem List   Diagnosis Date Noted   Symptomatic anemia 02/27/2022   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 [place] [address] Phone: [phone] Fax: [place], [place] [address] Phone: [phone] Fax: [phone]     Social Determinants of Health (SDOH) Interventions    Readmission Risk Interventions     No data to display

## 2022-03-01 NOTE — Evaluation (Signed)
Occupational Therapy Evaluation Patient Details Name: Mitchell Neal MRN: [id number] DOB: 05-26-1940 Today's Date: 03/01/2022   History of Present Illness Patient is a 81 year old male who presented with increased weight loss and SOB. 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 PMH: COPD, HTN, CKD, h/o smoking   Clinical Impression   Patient is a pleasant 81 year old male who was admitted for above. Patient was living at home alone prior level. Currently patient reported desire would be to go back home if possible but family is still deciding with recent changes in medical status. Patient was supervision for transition to and from bathroom with with no AD with cues for safety with lines. Patient was min guard for standing to complete hygiene tasks with cues. Patient was noted to have orthostatic response to standing with BP shown below. Patient would need 24/7 supervision in next level of care for safety cues and IADL tasks. Patient would continue to benefit from skilled OT services at this time while admitted and after d/c to address noted deficits in order to improve overall safety and independence in ADLs.   Blood pressures Supine: 154/62 mmhg HR 63 bpm Sitting: 135/78 mmhg HR 66bpm Standing: 108/60 mmhg HR 81 bpm Standing 3 mins: 156/85 mmhg.      Recommendations for follow up therapy are one component of a multi-disciplinary discharge planning process, led by the attending physician.  Recommendations may be updated based on patient status, additional functional criteria and insurance authorization.   Follow Up Recommendations  Home health OT (if family can provide 24/7 caregiver support in next level of care)     Assistance Recommended at Discharge Frequent or constant Supervision/Assistance  Patient can return home with the following Assistance with cooking/housework;Direct supervision/assist for medications management;Assist for transportation;Help with stairs or ramp  for entrance;Direct supervision/assist for financial management;A little help with bathing/dressing/bathroom    Functional Status Assessment  Patient has had a recent decline in their functional status and demonstrates the ability to make significant improvements in function in a reasonable and predictable amount of time.  Equipment Recommendations       Recommendations for Other Services       Precautions / Restrictions Precautions Precautions: Fall Precaution Comments: orthostatic, monitor O2 Restrictions Weight Bearing Restrictions: No      Mobility Bed Mobility Overal bed mobility: Needs Assistance Bed Mobility: Supine to Sit     Supine to sit: Supervision     General bed mobility comments: with increased time and cues to go slow         Balance Overall balance assessment: Mild deficits observed, not formally tested           ADL either performed or assessed with clinical judgement   ADL Overall ADL's : Needs assistance/impaired Eating/Feeding: Set up;Sitting   Grooming: Wash/dry face;Wash/dry hands;Min guard;Standing Grooming Details (indicate cue type and reason): at sink Upper Body Bathing: Min guard;Sitting   Lower Body Bathing: Min guard;Sitting/lateral leans;Sit to/from stand   Upper Body Dressing : Set up;Sitting   Lower Body Dressing: Min guard;Sit to/from stand;Sitting/lateral leans   Toilet Transfer: Min guard;Ambulation Toilet Transfer Details (indicate cue type and reason): to bathroom with increased time and use of grab bars Toileting- Clothing Manipulation and Hygiene: Min guard;Sit to/from stand       Functional mobility during ADLs: Min guard       Vision Patient Visual Report: No change from baseline       Perception  Praxis      Pertinent Vitals/Pain Pain Assessment Pain Assessment: No/denies pain     Hand Dominance Right   Extremity/Trunk Assessment Upper Extremity Assessment Upper Extremity Assessment:  Overall WFL for tasks assessed   Lower Extremity Assessment Lower Extremity Assessment: Defer to PT evaluation   Cervical / Trunk Assessment Cervical / Trunk Assessment: Normal   Communication Communication Communication: No difficulties   Cognition Arousal/Alertness: Awake/alert Behavior During Therapy: WFL for tasks assessed/performed Overall Cognitive Status: Difficult to assess           General Comments: patient was hard to assess with patietn currently digensting news of potential cancer with various decisions to make reguarding next steps.     General Comments  patient was noted to have small bruise near R orbital area.    Exercises     Shoulder Instructions      Home Living Family/patient expects to be discharged to:: Private residence Living Arrangements: Alone Available Help at Discharge: Family;Available PRN/intermittently Type of Home: House Home Access: Stairs to enter [employer] of Steps: 3   Home Layout: One level               Home Equipment: None          Prior Functioning/Environment Prior Level of Function : Independent/Modified Independent               ADLs Comments: patient endorsed having one fall about 3 weeks prior to admission        OT Problem List: Decreased safety awareness;Decreased cognition;Decreased knowledge of use of DME or AE;Cardiopulmonary status limiting activity;Decreased knowledge of precautions;Impaired balance (sitting and/or standing);Decreased activity tolerance      OT Treatment/Interventions: Self-care/ADL training;Therapeutic activities;Patient/family education;DME and/or AE instruction;Balance training    OT Goals(Current goals can be found in the care plan section) Acute Rehab OT Goals Patient Stated Goal: to go home OT Goal Formulation: Patient unable to participate in goal setting Time For Goal Achievement: 03/15/22 Potential to Achieve Goals: Fair  OT Frequency: Min 2X/week     Co-evaluation              AM-PAC OT "6 Clicks" Daily Activity     Outcome Measure Help from another person eating meals?: A Little Help from another person taking care of personal grooming?: A Little Help from another person toileting, which includes using toliet, bedpan, or urinal?: A Little Help from another person bathing (including washing, rinsing, drying)?: A Little Help from another person to put on and taking off regular upper body clothing?: A Little Help from another person to put on and taking off regular lower body clothing?: A Little 6 Click Score: 18   End of Session Equipment Utilized During Treatment: Gait belt Nurse Communication: Mobility status;Other (comment) (orthostatics with transitions)  Activity Tolerance: Patient tolerated treatment well Patient left: in chair;with call bell/phone within reach;with chair alarm set  OT Visit Diagnosis: Unsteadiness on feet (R26.81)                Time: 1610-9604 OT Time Calculation (min): 34 min Charges:  OT General Charges $OT Visit: 1 Visit OT Evaluation $OT Eval Low Complexity: 1 Low OT Treatments $Self Care/Home Management : 8-22 mins  Rennie Plowman, MS Acute Rehabilitation Department Office# [phone]   Willa Rough 03/01/2022, 3:28 PM

## 2022-03-01 NOTE — Consult Note (Signed)
Consultation Note Date: 03/01/2022   Patient Name: Mitchell Neal  DOB: [birth year]  MRN: [id number]  Age / Sex: 81 y.o., male   PCP: Mitchell Neal, Mitchell Him, MD Referring Physician: [name], MD  Reason for Consultation: Establishing goals of care     Chief Complaint/History of Present Illness:   Patient is an 81 year old male with a past medical history of hypertension, pericardial effusion, and iron deficiency anemia who was admitted on 02/27/2022 for management of worsening shortness of breath and weakness.  Of note patient has had worsening weakness and shortness of breath for some time and approximately lost 45 pounds in the past 3 months.  Patient had been following with Mitchell Neal/hematology for worsening normocytic normochromic anemia.  Imaging upon admission showed bilateral upper lobe malignancy with extension of the right lobe mass into the right mediastinum and possible invasion of the right tracheal wall.  Patient has also been receiving IV fluids for management of AKI.  After discussions, patient and family have determined patient would not want to undergo further workup for malignancy and would not want to focus on patient's comfort moving forward.  Palliative medicine team consulted to assist with further complex medical decision making.  Extensive review of EMR prior to seeing patient.  Presented to bedside and introduced myself and the role of the palliative medicine team in patient's care.  Patient seen laying in bed comfortably.  Present at patient's bedside was his youngest daughter, Mitchell Neal.  When seeing patient though he is pleasant and interactive, he is slow to respond and seems confused.  Per discussions with patient and daughter's assistance, they were able to explain they had heard about the imaging and that patient likely has metastatic cancer.  Patient has stated he would not want to undergo further workup for cancer and is at peace with knowing his end is near.  Tried to  further inquire with patient about the interventions he is currently getting such as IV fluids and lab work and patient was not able to discuss this in detail and what the consequences would be of continuing or discontinuing them.  With permission, able to discuss the philosophy of hospice and what it would and would not provide.  We discussed that most hospice cares provided to the patient in their own home.  Patient lives alone though and does not have 24/7 caregivers.  We discussed that patient will need a safe discharge option which would likely be long-term care with the addition of hospice support.  Did inform her daughter that usually there is an out-of-pocket cost associated with room and board when someone is requiring long-term care with hospice support.  Expressed that currently patient is not appropriate for inpatient hospice referral so would involve social worker to assist with safe discharge planning.   Patient was able to state simple things such as he does not want to be in pain or die suffering.  Patient denies currently being in pain or having any symptom burden at this time.  Daughter did state that patient has been complaining of chronic back pain so we discussed starting scheduled Tylenol to assist with relief.  Patient denied previously taking any opioid medications for pain management.  Answered all of patient's questions at that time and with his permission was able to speak with daughter Mitchell Neal outside of the room.  ------------------------------------------------------------------------------------------------------------- Advance Care Planning Conversation  Pertinent diagnosis: lung masses- likely metastatic cancer   The patient and/or family consented to a voluntary Mitchell Neal  Conversation. Individuals present for the conversation: Patient confused when seen and did not have capacity to participate in complex medical decision making. Spoke with both daughters, Mitchell Neal  and Mitchell Neal, during the day.   Summary of the conversation:  In speaking to Mitchell Neal privately, she admitted that patient's confusion has been worsening over the past month.  Did explore what patient was like prior to this mental status change.  She noted that he was a very independent and stubborn person who would not want assistance from others.  She did agree that her and her sister know patient would not want invasive medical procedures or to proceed with cancer directed therapies as that is not who he is as a person and would not be quality of life to Neal.  Acknowledged this and respected wishes to focus on symptom management moving forward.  Discussed importance of involving social worker to assist with safe discharge planning moving forward.    Mitchell Neal also stated that patient's functional status has greatly decreased. He has also been refusing to eat and drink. Normalized this as a part of body's shutting down.  Mitchell Neal noted patient has always been reluctant to complete any ACP documentation.  Noted at this time based on patient's current capacity assessment, I do not believe he can complete ACP documentation.  We discussed that based on [place] law since patient is legally divorced, medical decision making would fall to the majority of patient's children over 10 (patient's parents are deceased).  Daughters Mitchell Neal and Mitchell Neal have been assisting with medical decision making. Mitchell Neal mentioned that patient has a lawyer friend so recommended she reach out to legal counsel regarding obtaining power over patient's finances to assist with care management moving forward.   Presented to bedside later on the day of request of daughter Mitchell Neal to also meet with her sister Mitchell Neal.  Was able to meet with both of them privately away from patient to discuss matters.  They updated me that social worker Mitchell Neal had reached out to them and provided them with resources regarding long-term care placement for patient.  Encouraged  them to reach out to facilities as soon as possible today to coordinate matters regarding this.  Also and encouraged to call to a lawyer for recommendations about obtaining financial power as patient does not have capacity to make decisions regarding this at this time. Spent extensive time discussing end-of-life care and progression of patient's disease.  Discussed idea of focusing on patient's comfort for the time he does have remaining.  Explained the use of IV fluids and artificial nutrition and the fact that when someone is reaching the end of life this can actually cause more harm than benefit.  Discussed the process of dying and the natural process of bodies go through. After lengthy discussion, was able to personally complete MOST form with both daughters.  Also will transition patient to comfort focused care at this time.  Daughter's priority is that patient does not suffer at the end of his life no matter how long he has.  Patient is not safe to return home on his own and so daughters are actively investigating long-term placement for safe discharge.  Outcome of the conversations and/or documents completed:  Transitioning to comfort focused care at this time. Daughters completed MOST form focusing on comfort. Daughters actively investing long term placement for safe discharge planning.   I spent 58 minutes providing separately identifiable ACP services with the patient and/or surrogate decision maker in a voluntary, in-person conversation  discussing the patient's wishes and goals as detailed in the above note.  Chelsea Aus, DO Palliative Care Provider  -------------------------------------------------------------------------------------------------------------  All questions answered at that time.  Noted this provider would continue to follow along with patient's journey.  Updated TOC/SW, hospitalist, and bedside RN regarding plans for medical care. Will also plan to have MOST scanned into  EMR.   Primary Diagnoses  Present on Admission:  Symptomatic anemia  Essential hypertension  COPD with asthma   Palliative Review of Systems: Denied pain though mentation waxes and wanes.   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   Hearing loss    no hearing aids   HYPERTENSION 10/07/2009   Kidney stones    passed stones, no surgery required   Social History   Socioeconomic History   Marital status: Widowed    Spouse name: Not on file   Number of children: 2   Years of education: Not on file   Highest education level: Not on file  Occupational History   Not on file  Tobacco Use   Smoking status: Former    Packs/day: 0.50    Years: 25.00    Total pack years: 12.50    Types: Cigarettes    Quit date: 08/26/2010    Years since quitting: 11.5   Smokeless tobacco: Never  Vaping Use   Vaping Use: Never used  Substance and Sexual Activity   Alcohol use: No   Drug use: No   Sexual activity: Not on file  Other Topics Concern   Not on file  Social History Narrative   Not on file   Social Determinants of Health   Financial Resource Strain: Not on file  Food Insecurity: No Food Insecurity (02/28/2022)   Hunger Vital Sign    Worried About Running Out of Food in the Last Year: Never true    [place] in the Last Year: Never true  Transportation Needs: No Transportation Needs (02/28/2022)   PRAPARE - Hydrologist (Medical): No    Lack of Transportation (Non-Medical): No  Physical Activity: Not on file  Stress: Not on file  Social Connections: Not on file   Family History  Problem Relation Age of Onset   Heart disease Sister    Hypertension Other    Colon cancer Neg Hx    Colon polyps Neg Hx    Esophageal cancer Neg Hx    Rectal  cancer Neg Hx    Stomach cancer Neg Hx    Scheduled Meds:  FLUoxetine  20 mg Oral Daily   fluticasone  2 spray Each Nare Daily   mometasone-formoterol  2 puff Inhalation BID   Continuous Infusions:  lactated ringers 75 mL/hr at 03/01/22 0944   PRN Meds:. Allergies  Allergen Reactions   Prednisolone     unknown   CBC:    Component Value Date/Time   WBC 5.2 03/01/2022 0557   HGB 8.4 (L) 03/01/2022 0557   HGB 7.1 (L) 02/27/2022 1355   HCT 27.4 (L) 03/01/2022 0557   HCT 25.2 (L) 01/03/2022 1148   PLT 183 03/01/2022 0557   PLT 214 02/27/2022 1355   MCV 93.5 03/01/2022 0557   NEUTROABS 4.6 02/27/2022 1355  LYMPHSABS 1.1 02/27/2022 1355   MONOABS 0.9 02/27/2022 1355   EOSABS 0.1 02/27/2022 1355   BASOSABS 0.1 02/27/2022 1355   Comprehensive Metabolic Panel:    Component Value Date/Time   NA 136 03/01/2022 0557   K 4.4 03/01/2022 0557   CL 104 03/01/2022 0557   CO2 23 03/01/2022 0557   BUN 23 03/01/2022 0557   CREATININE 1.18 03/01/2022 0557   CREATININE 1.34 (H) 02/27/2022 1355   GLUCOSE 86 03/01/2022 0557   CALCIUM 8.9 03/01/2022 0557   AST 15 02/27/2022 1355   ALT 11 02/27/2022 1355   ALKPHOS 75 02/27/2022 1355   BILITOT 0.6 02/27/2022 1355   PROT 8.1 02/27/2022 1355   ALBUMIN 3.2 (L) 02/27/2022 1355    Physical Exam: Vital Signs: BP (!) 143/65 (BP Location: Right Arm)   Pulse 70   Temp (!) 97.4 F (36.3 C)   Resp 17   Ht 5\' 9"  (1.753 m)   Wt 66.6 kg   SpO2 95%   BMI 21.68 kg/m  SpO2: SpO2: 95 % O2 Device: O2 Device: Room Air O2 Flow Rate:   Intake/output summary:  Intake/Output Summary (Last 24 hours) at 03/01/2022 1016 Last data filed at 02/28/2022 2009 Gross per 24 hour  Intake 120.56 ml  Output --  Net 120.56 ml   LBM: Last BM Date : 02/28/22 Baseline Weight: Weight: 66.6 kg Most recent weight: Weight: 66.6 kg  General: NAD, awake, able to answer some simple questions though easily confused and slow to speak Eyes: no drainage noted HENT:  moist mucous membranes Cardiovascular: RRR, no edema in LE b/l Respiratory: no increased work of breathing noted, not in respiratory distress Abdomen: not distended Extremities: moving all extremities spontaneously  Skin: no rashes or lesions on visible skin Neuro: confused, slow to respond to questions          Palliative Performance Scale: 60%              Additional Data Reviewed: Recent Labs    02/28/22 0451 03/01/22 0557  WBC 6.2 5.2  HGB 8.4* 8.4*  PLT 211 183  NA 135 136  BUN 29* 23  CREATININE 1.41* 1.18    Imaging: CT Angio Chest PE W/Cm &/Or Wo Cm CLINICAL DATA:  Shortness of breath.  Concern for malignancy.  EXAM: CT ANGIOGRAPHY CHEST WITH CONTRAST  TECHNIQUE: Multidetector CT imaging of the chest was performed using the standard protocol during bolus administration of intravenous contrast. Multiplanar CT image reconstructions and MIPs were obtained to evaluate the vascular anatomy.  RADIATION DOSE REDUCTION: This exam was performed according to the departmental dose-optimization program which includes automated exposure control, adjustment of the mA and/or kV according to patient size and/or use of iterative reconstruction technique.  CONTRAST:  132mL OMNIPAQUE IOHEXOL 350 MG/ML SOLN  COMPARISON:  Earlier radiograph dated 02/27/2022.  FINDINGS: Cardiovascular: There is no cardiomegaly. Pericardial effusion measures 9 mm in thickness anterior to the heart. Advanced atherosclerotic calcification of the LAD and left coronary artery. Moderate atherosclerotic calcification of the thoracic aorta. No aneurysmal dilatation or dissection. Evaluation of the pulmonary arteries is limited due to respiratory motion. No pulmonary artery embolus identified.  Mediastinum/Nodes: Subcarinal adenopathy measures 3 cm in short axis. Right hilar adenopathy measures 14 mm in short axis. There is infiltrative mass in the right upper mediastinum and paratracheal region  measures approximately 5.5 x 8.2 cm in axial dimensions. The mass appears to abut the trachea with probable invasion of the right lateral tracheal wall. The  mass also abuts the right lateral aspect of the aortic arch. Mildly rounded bilateral supraclavicular lymph nodes.  Lungs/Pleura: Left upper lobe mass abuts the fissure and left lateral pleural surface and extends into the left hilum. This mass measures approximately 6 x 5 cm in axial dimensions. Right upper lobe spiculated mass measures 3.7 x 4.6 cm and contiguous with the described mediastinal infiltrative mass. No pleural effusion or pneumothorax. The central airways remain patent.  Upper Abdomen: Gallstones. Indeterminate 3 cm splenic hypodense lesion.  Musculoskeletal: Subcentimeter sclerotic focus involving T1 vertebra, nonspecific. No acute osseous pathology.  Review of the MIP images confirms the above findings.  IMPRESSION: 1. No CT evidence of pulmonary artery embolus. 2. 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 There is probable invasion of the mass into the right tracheal wall. 3. Mediastinal and right hilar adenopathy. 4. Cholelithiasis. 5. Indeterminate 3 cm splenic hypodense lesion. 6. Advanced atherosclerotic calcification of the LAD and left coronary artery. 7.  Aortic Atherosclerosis (ICD10-I70.0).  Electronically Signed   By: Anner Crete M.D.   On: 02/27/2022 18:44 DG Chest Port 1 View CLINICAL DATA:  Shortness of breath.  EXAM: PORTABLE CHEST 1 VIEW  COMPARISON:  Chest radiograph dated 06/23/2011.  FINDINGS: Rounded opacity in the left upper lung field measures approximately 7 x 7 cm and may represent pneumonia with concerning for a mass. Further evaluation with CT is recommended. Additional rounded density in the right upper lobe measures 3 x 5 cm. There is no pleural effusion or pneumothorax. The cardiac silhouette is  within normal limits. There is thickening of the right paratracheal soft tissues which may represent adenopathy or infiltrative mass. No acute osseous pathology.  IMPRESSION: Bilateral upper lobe opacities concerning for neoplasm. Further evaluation with CT is recommended.  Electronically Signed   By: Anner Crete M.D.   On: 02/27/2022 17:57   I personally reviewed recent imaging.   Palliative Care Assessment and Plan Summary of Established Goals of Care and Medical Treatment Preferences   Patient is an 81 year old male with a past medical history of hypertension, pericardial effusion, and iron deficiency anemia who was admitted on 02/27/2022 for management of worsening shortness of breath and weakness.  Of note patient has had worsening weakness and shortness of breath for some time and approximately lost 45 pounds in the past 3 months.  Patient had been following with Mitchell Neal/hematology for worsening normocytic normochromic anemia.  Imaging upon admission showed bilateral upper lobe malignancy with extension of the right lobe mass into the right mediastinum and possible invasion of the right tracheal wall.  Patient has also been receiving IV fluids for management of AKI.  After discussions, patient and family have determined patient would not want to undergo further workup for malignancy and would not want to focus on patient's comfort moving forward.  Palliative medicine team consulted to assist with further complex medical decision making.  # Complex medical decision making/goals of care  -When seeing patient today he is confused with slowed speech.  Patient able to participate in simple questioning though not able to participate in complex medical decision-making.  Patient does not have capacity at time of evaluation to participate in extensive goals of care conversations.  -Spent extensive time meeting with both daughters during the day as described above in HPI.  Patient is divorced.   Patient does not have capacity at time of evaluation to participate in complex medical decision-making.  Based on [place]  laws the medical decision making would fall to the majority of patient's adult children over the age of 38 and parents.  Patient has 2 biological daughters and 1 stepdaughter.  Both biological daughter is present today which would incorporate majority of patient's children and allow them to make medical decisions on behalf of the patient.  -MOST form completed today and will be scanned into EMR.  -Daughter's focus for care is on patient's comfort moving forward and that he does not suffer at the end of his life.  This is in line with who the patient is as a person. At this time we will discontinue interventions that are no longer focused on comfort such as IV fluids, imaging, or lab work.  Will instead focus on symptom management of pain, dyspnea, and agitation in the setting of end-of-life care.  -Patient unsafe to return home without 24/7 assistance which cannot be provided by family.  Daughter is actively seeking long-term care placement for safe discharge planning.  Hoping to have hospice involvement with long term care placement.   -  Code Status: DNR  Prognosis: weeks to months (<6 months prognosis) in setting of extensive lung mass involvement not seeking any workup/cancer directed therapies, potentially metastatic disease to brain based on worsening confusion over time, AKI on admission due to decreased po intake, decline in functional status   # Symptom management  -Pain/dyspnea, in the setting of comfort focused care with known bilateral lung masses and likely metastatic cancer   -Start Tylenol 1000 mg scheduled every 8 hours during the day   -Start oxycodone 2.5 mg every 4 hours as needed   -Started IV Dilaudid 0.2 mg every 2 hours as needed   -Agitation/anxiety, in the setting of comfort focused care with known bilateral lung masses and likely metastatic  cancer   -Start Ativan (IV/SL/po) as needed   -Start Haldol (IV/SL/po) as needed  # Psycho-social/Spiritual Support:  - Support System: Patient is divorced.  Patient lives alone.  Patient's 2 biological daughters actively involved in supporting care. - Desire for further Chaplain support: Chaplain already following and care notes reviewed  # Discharge Planning: Patient unsafe to return home without 24/7 care.  Daughter is actively seeking long-term care placement for safe discharge planning.  Appreciate social worker's assistance in this matter.  -Planning for involvement of hospice at long-term care facility to support patient's comfort moving forward.  Thank you for allowing the palliative care team to participate in the care Mitchell Neal.  Chelsea Aus, DO Palliative Care Provider PMT # [phone]  If patient remains symptomatic despite maximum doses, please call PMT at [phone] between 0700 and 1900. Outside of these hours, please call attending, as PMT does not have night coverage.

## 2022-03-01 NOTE — Progress Notes (Signed)
Chaplain engaged in follow-up visit with Mitchell Neal and his daughters.  Chaplain learned about Mitchell Neal's life and interests.  He is originally from [place], [place] and grew up on a family farm there.  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  Mitchell Neal shared how creativity and art has been generational for his family.  He had an uncle who was a Geophysicist/field seismologist.   Mitchell Neal also spent time talking about his experience with [facility] and voiced that everyone has treated him so well.  He feels comfortable and trusts the care that he is getting.   Mitchell Neal is apart of a blended family with two biological daughters and a bonus daughter.   Chaplain offered reflective listening and community during this time.     03/01/22 1400  Clinical Encounter Type  Visited With Patient and family together  Visit Type Follow-up;Spiritual support

## 2022-03-02 DIAGNOSIS — F419 Anxiety disorder, unspecified: Secondary | ICD-10-CM | POA: Diagnosis present

## 2022-03-02 DIAGNOSIS — G8929 Other chronic pain: Secondary | ICD-10-CM | POA: Diagnosis present

## 2022-03-02 DIAGNOSIS — D630 Anemia in neoplastic disease: Secondary | ICD-10-CM | POA: Diagnosis present

## 2022-03-02 DIAGNOSIS — Z79899 Other long term (current) drug therapy: Secondary | ICD-10-CM

## 2022-03-02 DIAGNOSIS — D509 Iron deficiency anemia, unspecified: Secondary | ICD-10-CM | POA: Diagnosis present

## 2022-03-02 DIAGNOSIS — T148XXA Other injury of unspecified body region, initial encounter: Secondary | ICD-10-CM | POA: Diagnosis present

## 2022-03-02 DIAGNOSIS — E538 Deficiency of other specified B group vitamins: Secondary | ICD-10-CM | POA: Diagnosis present

## 2022-03-02 DIAGNOSIS — R451 Restlessness and agitation: Secondary | ICD-10-CM | POA: Diagnosis not present

## 2022-03-02 DIAGNOSIS — R627 Adult failure to thrive: Secondary | ICD-10-CM

## 2022-03-02 DIAGNOSIS — H9190 Unspecified hearing loss, unspecified ear: Secondary | ICD-10-CM | POA: Diagnosis present

## 2022-03-02 DIAGNOSIS — E039 Hypothyroidism, unspecified: Secondary | ICD-10-CM | POA: Diagnosis present

## 2022-03-02 DIAGNOSIS — R0600 Dyspnea, unspecified: Secondary | ICD-10-CM | POA: Diagnosis not present

## 2022-03-02 DIAGNOSIS — N179 Acute kidney failure, unspecified: Secondary | ICD-10-CM | POA: Diagnosis present

## 2022-03-02 DIAGNOSIS — Z66 Do not resuscitate: Secondary | ICD-10-CM | POA: Diagnosis present

## 2022-03-02 DIAGNOSIS — D649 Anemia, unspecified: Secondary | ICD-10-CM | POA: Diagnosis not present

## 2022-03-02 DIAGNOSIS — R634 Abnormal weight loss: Secondary | ICD-10-CM | POA: Diagnosis present

## 2022-03-02 DIAGNOSIS — R918 Other nonspecific abnormal finding of lung field: Secondary | ICD-10-CM | POA: Diagnosis present

## 2022-03-02 DIAGNOSIS — N189 Chronic kidney disease, unspecified: Secondary | ICD-10-CM | POA: Diagnosis present

## 2022-03-02 DIAGNOSIS — Z515 Encounter for palliative care: Secondary | ICD-10-CM | POA: Diagnosis not present

## 2022-03-02 DIAGNOSIS — C3411 Malignant neoplasm of upper lobe, right bronchus or lung: Secondary | ICD-10-CM | POA: Diagnosis present

## 2022-03-02 DIAGNOSIS — F32A Depression, unspecified: Secondary | ICD-10-CM | POA: Diagnosis present

## 2022-03-02 DIAGNOSIS — I129 Hypertensive chronic kidney disease with stage 1 through stage 4 chronic kidney disease, or unspecified chronic kidney disease: Secondary | ICD-10-CM | POA: Diagnosis present

## 2022-03-02 DIAGNOSIS — E861 Hypovolemia: Secondary | ICD-10-CM | POA: Diagnosis present

## 2022-03-02 DIAGNOSIS — R52 Pain, unspecified: Secondary | ICD-10-CM

## 2022-03-02 DIAGNOSIS — C7800 Secondary malignant neoplasm of unspecified lung: Secondary | ICD-10-CM | POA: Diagnosis present

## 2022-03-02 DIAGNOSIS — I2510 Atherosclerotic heart disease of native coronary artery without angina pectoris: Secondary | ICD-10-CM | POA: Diagnosis present

## 2022-03-02 DIAGNOSIS — Z87891 Personal history of nicotine dependence: Secondary | ICD-10-CM | POA: Diagnosis not present

## 2022-03-02 DIAGNOSIS — Z1152 Encounter for screening for COVID-19: Secondary | ICD-10-CM | POA: Diagnosis not present

## 2022-03-02 DIAGNOSIS — W19XXXA Unspecified fall, initial encounter: Secondary | ICD-10-CM | POA: Diagnosis present

## 2022-03-02 DIAGNOSIS — C799 Secondary malignant neoplasm of unspecified site: Secondary | ICD-10-CM | POA: Diagnosis present

## 2022-03-02 NOTE — TOC Progression Note (Signed)
Transition of Care [facility]) - Progression Note    Patient Details  Name: Mitchell Neal MRN: [id number] Date of Birth: 1940/07/18  Transition of Care [facility]) CM/SW Ginger Blue, [place] Phone Number: 03/02/2022, 9:19 AM  Clinical Narrative:    Spoke with pt's daughter, Mitchell Neal who shares they have been connected to [facility] who is assisting with LTC placement. They are to view facility today for possible placement. They plan to contact [facility] to obtain more information about their services.  CSW encouraged pt's daughters to apply for Medicaid for this pt as this would cover LTC placement. Discussed application process and need to spend down income in order for pt to qualify for Medicaid.    Expected Discharge Plan: Home w Hospice Care Barriers to Discharge: No Barriers Identified  Expected Discharge Plan and Services Expected Discharge Plan: [place] In-house Referral: Hospice / [place], Chaplain Discharge Planning Services: CM Consult Post Acute Care Choice: Hospice Living arrangements for the past 2 months: Single Family Home                 DME Arranged: N/A DME Agency: NA                   Social Determinants of Health (SDOH) Interventions    Readmission Risk Interventions     No data to display

## 2022-03-02 NOTE — Progress Notes (Signed)
PROGRESS NOTE    Mitchell Neal  RAX:[id number]  DOB: 09/10/1940  DOA: 02/27/2022 PCP: Haywood Pao, MD    Hospital course: 81 year old man was admitted for increasing shortness of breath and 45 pound weight loss.  He had been recently been treated for anemia by heme-onc.  Workup in ED however revealed bilateral upper lobe lung masses with possible invasion of trachea and chest wall.  Patient declines further workup or biopsy and would like to be referred to hospice.  Palliative and oncology consulted.    Subjective: Today, met with patient resting comfortably, easily arousable.  Denies any pain.    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 Adult failure to thrive Declining workup and treatment, switch to comfort care on 03/01/2022 Palliative care on board Oncology consulted Comfort care orders placed  AKI Resolved s/p IV fluid  HTN Patient is normotensive off of all of his hypertensive medications Likely secondary to 45 pound weight loss Discontinued all BP meds  Normocytic anemia Anemia of chronic disease Hemoglobin stable Patient received 1 unit of PRBC on 02/27/2022  COPD Stable  Goals of care discussion Patient is now comfort care Social work on board for safe discharge plans, may require long-term care with hospice services   Objective: Vitals:   03/01/22 1404 03/01/22 2010 03/01/22 2034 03/02/22 1405  BP: (!) 151/65 (!) 160/67  (!) 150/65  Pulse: 63 71  65  Resp: _0 Temp: 97.9 F (36.6 C) (!) 97.4 F (36.3 C)  97.8 F (36.6 C)  TempSrc: Oral Oral    SpO2: 99% 100% 98% 100%  Weight:      Height:        Intake/Output Summary (Last 24 hours) at  03/02/2022 1524 Last data filed at 03/01/2022 1552 Gross per 24 hour  Intake 58.7 ml  Output --  Net 58.7 ml   Filed Weights   02/27/22 1523  Weight: 66.6 kg     Exam: General: NAD, cachectic, chronically ill-appearing, intermittently confused Cardiovascular: S1, S2 present Respiratory: CTAB Abdomen: Soft, nontender, nondistended, bowel sounds present Musculoskeletal: No bilateral pedal edema noted Skin: Normal Psychiatry: Fair mood   Scheduled Meds:  acetaminophen  1,000 mg Oral Q8H   FLUoxetine  20 mg Oral Daily   fluticasone  2 spray Each Nare Daily   mometasone-formoterol  2 puff Inhalation BID   Continuous Infusions:  Data Reviewed:  Basic Metabolic Panel: Recent Labs  Lab 02/27/22 1355 02/28/22 0451 03/01/22 0557  NA 135 135 136  K 4.5 4.4 4.4  CL 102 104 104  CO2 _1 GLUCOSE 91 93 86  BUN 29* 29* 23  CREATININE 1.34* 1.41* 1.18  CALCIUM 9.5 8.9 8.9    CBC: Recent Labs  Lab 02/27/22 1355 02/28/22 0451 03/01/22 0557  WBC 6.7 6.2 5.2  NEUTROABS 4.6  --   --   HGB 7.1* 8.4* 8.4*  HCT 22.9* 27.3* 27.4*  MCV 93.9 93.5 93.5  PLT 214 211 183        DVT prophylaxis: None Code Status: DNR Family Communication: None at bedside Disposition Plan:   Patient is from: Home  Anticipated Discharge  Location: Possible Long-term residential  Barriers to Discharge: Safe discharge plan    Studies: No results found.      [place]  If 7PM-7AM, please contact night-coverage [email or web address]   LOS: 0 days

## 2022-03-02 NOTE — Progress Notes (Signed)
Daily Progress Note   Patient Name: Mitchell Neal       Date: 03/02/2022 DOB: 20-Mar-1941  Age: 81 y.o. MRN#: [id number] Attending Physician: [name], MD Primary Care Physician: Haywood Pao, MD Admit Date: 02/27/2022 Length of Stay: 0 days  Reason for Consultation/Follow-up: Establishing goals of care  Subjective:   CC: Patient seen laying in bed today.  Denies any symptoms of concern.  Following up regarding complex medical decision making and symptom management.  Subjective:  Informed by social worker daughters are touring possible long-term care facilities today.  Presented to bedside in afternoon to check on patient.  Daughter is not present at bedside.  Patient laying in bed comfortably.  Reintroduced myself as a member of the palliative care team.  Inquired about patient's symptom burden which she denied any concerns of pain or shortness of breath at this time.  While patient was more alert and interactive today, still confused at times.  Patient can discussed very precise memories from the past though at times would mention things that are not there such as a gun being under the table.  Spent time providing emotional support.  Noted hope to speak to daughter's when able.  Patient is greatly appreciative for all his daughter's assistance.  Review of Systems  Objective:   Vital Signs:  BP (!) 160/67 (BP Location: Left Arm)   Pulse 71   Temp (!) 97.4 F (36.3 C) (Oral)   Resp 20   Ht 5\' 9"  (1.753 m)   Wt 66.6 kg   SpO2 98%   BMI 21.68 kg/m   Physical Exam: General: NAD, awake, muscle wasting present Eyes: conjunctiva clear, anicteric sclera HENT: normocephalic, atraumatic, moist mucous membranes Cardiovascular: RRR, no edema in LE b/l Respiratory: no increased work of breathing noted, not in respiratory distress Abdomen: not distended Extremities: Moving all extremities spontaneously Skin: no rashes or lesions on visible skin Neuro: Able to interact  though confused Psych: Pleasant  Imaging: I personally reviewed recent imaging.   Assessment & Plan:   Assessment: Patient is an 81 year old male with a past medical history of hypertension, pericardial effusion, and iron deficiency anemia who was admitted on 02/27/2022 for management of worsening shortness of breath and weakness. Of note patient has had worsening weakness and shortness of breath for some time and approximately lost 45 pounds in the past 3 months. Patient had been following with [name]/hematology for worsening normocytic normochromic anemia. Imaging upon admission showed bilateral upper lobe malignancy with extension of the right lobe mass into the right mediastinum and possible invasion of the right tracheal wall. Patient has also been receiving IV fluids for management of AKI. After discussions, patient and family have determined patient would not want to undergo further workup for malignancy and would not want to focus on patient's comfort moving forward. Palliative medicine team consulted to assist with further complex medical decision making.   Recommendations/Plan: # Complex medical decision making/goals of care:  - Patient more awake and interactive today to participate in conversation.  Those thoughts can still be tangential and stating things are in the room that are not present.  Patient does not have capacity at time of evaluation to participate in extensive goals of care conversations.                -Had spent extensive time meeting with daughters on 03/01/2022.  Patient transitioned to comfort care focus on 03/01/22.  Daughters seeking safe discharge planning such as potential long-term care  placement with hospice support.  Did not see daughters today to further discuss.  Will plan to follow-up tomorrow.                -MOST form completed 03/01/22 and will be scanned into EMR.                -  Code Status: DNR  Prognosis: weeks to months (<6 months prognosis) in setting of  extensive lung mass involvement not seeking any workup/cancer directed therapies, potentially metastatic disease to brain based on worsening confusion over time, AKI on admission due to decreased po intake, decline in functional status   # Symptom management:   -Pain/dyspnea, in the setting of comfort focused care with known bilateral lung masses and likely metastatic cancer                               -Continue Tylenol 1000 mg scheduled every 8 hours during the day                               -Continue oxycodone 2.5 mg every 4 hours as needed                               -Continue IV Dilaudid 0.2 mg every 2 hours as needed                  -Agitation/anxiety, in the setting of comfort focused care with known bilateral lung masses and likely metastatic cancer                               -Continue Ativan (IV/SL/po) as needed                               -Continue Haldol (IV/SL/po) as needed   # Psycho-social/Spiritual Support:  - Support System: Patient is divorced.  Patient lives alone.  Patient's 2 biological daughters actively involved in supporting care. - Desire for further Chaplain support: Chaplain already following and care notes reviewed   # Discharge Planning: Patient unsafe to return home without 24/7 care.  Daughters actively seeking long-term care placement with hospice support for safe discharge planning.  Continue to appreciate social worker's assistance in this matter.    Discussed with: Patient, social worker, hospitalist  Thank you for allowing the palliative care team to participate in the care [name].  Chelsea Aus, DO Palliative Care Provider PMT # [phone]  If patient remains symptomatic despite maximum doses, please call PMT at [phone] between 0700 and 1900. Outside of these hours, please call attending, as PMT does not have night coverage.  This provider spent a total of 36 minutes providing patient's care.  Includes review of EMR, discussing  care with other staff members involved in patient's medical care, obtaining relevant history and information from patient and/or patient's family, and personal review of imaging and lab work. Greater than 50% of the time was spent counseling and coordinating care related to the above assessment and plan.

## 2022-03-03 ENCOUNTER — Encounter (HOSPITAL_COMMUNITY): Payer: Self-pay | Admitting: Hematology and Oncology

## 2022-03-03 DIAGNOSIS — D649 Anemia, unspecified: Secondary | ICD-10-CM | POA: Diagnosis not present

## 2022-03-03 NOTE — TOC Progression Note (Signed)
Transition of Care [facility]) - Progression Note    Patient Details  Name: RAJVEER HANDLER MRN: [id number] Date of Birth: 05-04-1940  Transition of Care [facility]) CM/SW [place], LCSW Phone Number: 03/03/2022, 1:24 PM  Clinical Narrative:    Pt's family have been in contact with [facility] for Hospice Services. They have viewed facility yesterday and are to view facility today. Pt's daughters aware pt is medically ready to discharge from the hospital and that placement needs to be obtained as soon as possible.  Family have been in contact with attorney to discuss becoming POA over pt's finances however, attorney has shared that if pt does not have capacity he will be unable to sign for them to become POA. They have looked into guardianship however, the time frame for obtaining guardianship may be well past time frame of pt/family needing guardianship.  CSW will continue to assist with placement for this pt.     Expected Discharge Plan: Home w Hospice Care Barriers to Discharge: No Barriers Identified  Expected Discharge Plan and Services Expected Discharge Plan: [place] In-house Referral: Hospice / [place], Chaplain Discharge Planning Services: CM Consult Post Acute Care Choice: Hospice Living arrangements for the past 2 months: Single Family Home                 DME Arranged: N/A DME Agency: NA                   Social Determinants of Health (SDOH) Interventions    Readmission Risk Interventions     No data to display

## 2022-03-03 NOTE — Plan of Care (Signed)

## 2022-03-03 NOTE — Progress Notes (Signed)
PROGRESS NOTE    Mitchell Neal  LSL:[id number] DOB: [birth year] DOA: 02/27/2022 PCP: Haywood Pao, MD   Brief Narrative: 28 with PMH significant for symptomatic anemia, lung mass, COPD, asthma, hypertension, mediastinal mass, presented with worsening shortness of breath, 45 pound weight loss.  Recently being treated for anemia by heme-onc.  Workup in the ED revealed bilateral upper lobe lung masses with possible invasion's of the trachea and chest wall.  Patient declined further workup and biopsy.  Palliative care and oncology consulted.  He has been transitioned to comfort care as of 03/01/2022.  Currently awaiting disposition, will need long-term care with hospice care.    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    AKI Resolved s/p IV fluid   HTN Patient is normotensive off of all of his hypertensive medications Likely secondary to 45 pound weight loss Discontinued all BP meds   Normocytic anemia Anemia of chronic disease Hemoglobin stable Patient received 1 unit of PRBC on 02/27/2022   COPD Stable   Goals of care discussion Patient is now comfort care Social work on board for safe discharge plans, may require long-term care with hospice services     Estimated body mass index is 21.68 kg/m as calculated from the following:   Height as of this encounter: 5\' 9"  (1.753 m).   Weight as of this encounter: 66.6 kg.   DVT prophylaxis: Comfort care, none Code Status:  DNR Family Communication: Daughter at bedside.  Disposition Plan:  Status is: Inpatient Remains inpatient appropriate because: awaiting SNF for long term care and hospice care.     Consultants:  Oncology Palliative  Procedures:    Antimicrobials:    Subjective: He is alert, he had BM. He report mild hip He has been eating some.    Objective: Vitals:   03/02/22 1405 03/02/22 1929 03/02/22 1952 03/03/22 0818  BP: (!) 150/65 (!) 158/67    Pulse: 65 72    Resp: 18 17    Temp: 97.8 F (36.6 C) (!) 97.3 F (36.3 C)    TempSrc:  Oral    SpO2: 100% 100% 96% 98%  Weight:      Height:       No intake or output data in the 24 hours ending 03/03/22 1425 Filed Weights   02/27/22 1523  Weight: 66.6 kg    Examination:  General exam: Appears calm and comfortable  Respiratory system: Clear to auscultation. Respiratory effort normal. Cardiovascular system: S1 & S2 heard, RRR. No JVD, murmurs, rubs, gallops or clicks. No pedal edema. Gastrointestinal system: Abdomen is nondistended, soft and nontender. No organomegaly or masses felt. Normal bowel sounds heard. Central nervous system: Alert and oriented. No focal neurological deficits. Extremities: Symmetric 5 x 5 power.   Data Reviewed: I have personally reviewed following labs and imaging studies  CBC: Recent Labs  Lab 02/27/22 1355 02/28/22 0451 03/01/22 0557  WBC 6.7 6.2 5.2  NEUTROABS 4.6  --   --   HGB  7.1* 8.4* 8.4*  HCT 22.9* 27.3* 27.4*  MCV 93.9 93.5 93.5  PLT 214 211 016   Basic Metabolic Panel: Recent Labs  Lab 02/27/22 1355 02/28/22 0451 03/01/22 0557  NA 135 135 136  K 4.5 4.4 4.4  CL 102 104 104  CO2 26 23 23   GLUCOSE 91 93 86  BUN 29* 29* 23  CREATININE 1.34* 1.41* 1.18  CALCIUM 9.5 8.9 8.9   GFR: Estimated Creatinine Clearance: 46.3 mL/min (by C-G formula based on SCr of 1.18 mg/dL). Liver Function Tests: Recent Labs  Lab 02/27/22 1355  AST 15  ALT 11  ALKPHOS 75  BILITOT 0.6   PROT 8.1  ALBUMIN 3.2*   No results for input(s): "LIPASE", "AMYLASE" in the last 168 hours. No results for input(s): "AMMONIA" in the last 168 hours. Coagulation Profile: Recent Labs  Lab 02/27/22 1610  INR 1.4*   Cardiac Enzymes: No results for input(s): "CKTOTAL", "CKMB", "CKMBINDEX", "TROPONINI" in the last 168 hours. BNP (last 3 results) No results for input(s): "PROBNP" in the last 8760 hours. HbA1C: No results for input(s): "HGBA1C" in the last 72 hours. CBG: No results for input(s): "GLUCAP" in the last 168 hours. Lipid Profile: No results for input(s): "CHOL", "HDL", "LDLCALC", "TRIG", "CHOLHDL", "LDLDIRECT" in the last 72 hours. Thyroid Function Tests: No results for input(s): "TSH", "T4TOTAL", "FREET4", "T3FREE", "THYROIDAB" in the last 72 hours. Anemia Panel: No results for input(s): "VITAMINB12", "FOLATE", "FERRITIN", "TIBC", "IRON", "RETICCTPCT" in the last 72 hours. Sepsis Labs: Recent Labs  Lab 02/27/22 1610  LATICACIDVEN 1.2    Recent Results (from the past 240 hour(s))  Resp Panel by RT-PCR (Flu A&B, Covid) Anterior Nasal Swab     Status: None   Collection Time: 02/27/22  4:29 PM   Specimen: Anterior Nasal Swab  Result Value Ref Range Status   SARS Coronavirus 2 by RT PCR NEGATIVE NEGATIVE Final    Comment: (NOTE) SARS-CoV-2 target nucleic acids are NOT DETECTED.  The SARS-CoV-2 RNA is generally detectable in upper respiratory specimens during the acute phase of infection. The lowest concentration of SARS-CoV-2 viral copies this assay can detect is 138 copies/mL. A negative result does not preclude SARS-Cov-2 infection and should not be used as the sole basis for treatment or other patient management decisions. A negative result may occur with  improper specimen collection/handling, submission of specimen other than nasopharyngeal swab, presence of viral mutation(s) within the areas targeted by this assay, and inadequate number of  viral copies(<138 copies/mL). A negative result must be combined with clinical observations, patient history, and epidemiological information. The expected result is Negative.  Fact Sheet for Patients:  EntrepreneurPulse.com.au  Fact Sheet for Healthcare Providers:  IncredibleEmployment.be  This test is no t yet approved or cleared by the [place] FDA and  has been authorized for detection and/or diagnosis of SARS-CoV-2 by FDA under an Emergency Use Authorization (EUA). This EUA will remain  in effect (meaning this test can be used) for the duration of the COVID-19 declaration under Section 564(b)(1) of the Act, 21 U.S.C.section 360bbb-3(b)(1), unless the authorization is terminated  or revoked sooner.       Influenza A by PCR NEGATIVE NEGATIVE Final   Influenza B by PCR NEGATIVE NEGATIVE Final    Comment: (NOTE) The Xpert Xpress SARS-CoV-2/FLU/RSV plus assay is intended as an aid in the diagnosis of influenza from Nasopharyngeal swab specimens and should not be used as a sole basis for treatment. Nasal washings and aspirates are unacceptable for Xpert  Xpress SARS-CoV-2/FLU/RSV testing.  Fact Sheet for Patients: EntrepreneurPulse.com.au  Fact Sheet for Healthcare Providers: IncredibleEmployment.be  This test is not yet approved or cleared by the [place] FDA and has been authorized for detection and/or diagnosis of SARS-CoV-2 by FDA under an Emergency Use Authorization (EUA). This EUA will remain in effect (meaning this test can be used) for the duration of the COVID-19 declaration under Section 564(b)(1) of the Act, 21 U.S.C. section 360bbb-3(b)(1), unless the authorization is terminated or revoked.  Performed at [facility], [place] [address] [place], [place] [address]          Radiology Studies: No results found.      Scheduled Meds:  acetaminophen  1,000  mg Oral Q8H   FLUoxetine  20 mg Oral Daily   fluticasone  2 spray Each Nare Daily   mometasone-formoterol  2 puff Inhalation BID   Continuous Infusions:   LOS: 1 day    Time spent: 35 minutes.     Elmarie Shiley, MD [facility]   If 7PM-7AM, please contact night-coverage [email or web address]  03/03/2022, 2:25 PM

## 2022-03-03 NOTE — Progress Notes (Signed)
Mobility Specialist - Progress Note   03/03/22 1539  Mobility  Activity Ambulated with assistance in hallway  Level of Assistance Modified independent, requires aide device or extra time  Assistive Device Front wheel walker  Distance Ambulated (ft) 500 ft  Activity Response Tolerated well  Mobility Referral Yes  $Mobility charge 1 Mobility   Pt received in bed and agreeable to mobility. No complaints during mobility session. Pt to recliner after session with all needs met.   [facility]

## 2022-03-04 DIAGNOSIS — R918 Other nonspecific abnormal finding of lung field: Secondary | ICD-10-CM | POA: Diagnosis not present

## 2022-03-04 DIAGNOSIS — Z7189 Other specified counseling: Secondary | ICD-10-CM

## 2022-03-04 DIAGNOSIS — R0600 Dyspnea, unspecified: Secondary | ICD-10-CM | POA: Diagnosis not present

## 2022-03-04 DIAGNOSIS — R451 Restlessness and agitation: Secondary | ICD-10-CM | POA: Diagnosis not present

## 2022-03-04 DIAGNOSIS — R4589 Other symptoms and signs involving emotional state: Secondary | ICD-10-CM

## 2022-03-04 DIAGNOSIS — Z515 Encounter for palliative care: Secondary | ICD-10-CM | POA: Diagnosis not present

## 2022-03-04 NOTE — Progress Notes (Signed)
Mobility Specialist - Progress Note   03/04/22 1117  Mobility  Activity Ambulated with assistance in hallway  Level of Assistance Modified independent, requires aide device or extra time  Assistive Device Front wheel walker  Distance Ambulated (ft) 480 ft  Activity Response Tolerated well  Mobility Referral Yes  $Mobility charge 1 Mobility   Pt received in bed and agreeable to mobility. No complaints during mobility. Pt to bed after session with all needs met.     [facility]

## 2022-03-04 NOTE — Plan of Care (Signed)

## 2022-03-04 NOTE — Progress Notes (Signed)
PROGRESS NOTE    Mitchell Neal  PFX:[id number] DOB: 1941/02/12 DOA: 02/27/2022 PCP: Haywood Pao, MD   Brief Narrative: 16 with PMH significant for symptomatic anemia, lung mass, COPD, asthma, hypertension, mediastinal mass, presented with worsening shortness of breath, 45 pound weight loss.  Recently being treated for anemia by heme-onc.  Workup in the ED revealed bilateral upper lobe lung masses with possible invasion's of the trachea and chest wall.  Patient declined further workup and biopsy.  Palliative care and oncology consulted.  He has been transitioned to comfort care as of 03/01/2022.  Currently awaiting disposition, will need long-term care with hospice care.    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    AKI Resolved s/p IV fluid   HTN Patient is normotensive off of all of his hypertensive medications Likely secondary to 45 pound weight loss Discontinued all BP meds   Normocytic anemia Anemia of chronic disease Hemoglobin stable Patient received 1 unit of PRBC on 02/27/2022   COPD Stable   Goals of care discussion Patient is now comfort care Social work on board for safe discharge plans, may require long-term care with hospice services   Estimated body mass index is 21.68 kg/m as calculated from the following:   Height as of this encounter: 5\' 9"  (1.753 m).   Weight as of this encounter:  66.6 kg.   DVT prophylaxis: Comfort care, none Code Status: DNR Family Communication: Daughter at bedside.  Disposition Plan:  Status is: Inpatient Remains inpatient appropriate because: awaiting SNF for long term care and hospice care.    Consultants:  Oncology Palliative  Procedures:    Antimicrobials:    Subjective: He is alert, reports right hip pain. He will let us know if he needs medicatoin for it.   Objective: Vitals:   03/03/22 1624 03/03/22 2003 03/04/22 0801 03/04/22 1327  BP: (!) 148/68   138/69  Pulse: 87   76  Resp: 16   18  Temp: 98.1 F (36.7 C)   97.6 F (36.4 C)  TempSrc: Axillary   Oral  SpO2: 97% 96% 98% 100%  Weight:      Height:        Intake/Output Summary (Last 24 hours) at 03/04/2022 1458 Last data filed at 03/04/2022 1300 Gross per 24 hour  Intake 1902 ml  Output --  Net 1902 ml   Filed Weights   02/27/22 1523  Weight: 66.6 kg    Examination:  General exam: Appears calm and comfortable  Respiratory system: Clear to auscultation. Respiratory effort normal. Cardiovascular system: S1 & S2 heard, RRR. No JVD, murmurs, rubs, gallops or clicks. No pedal edema. Gastrointestinal system: Abdomen is nondistended, soft and nontender. No organomegaly or masses felt. Normal bowel sounds heard. Central nervous system: Alert and oriented. No focal neurological deficits. Extremities: Symmetric 5 x 5 power.   Data Reviewed: I have personally reviewed following labs and  imaging studies  CBC: Recent Labs  Lab 02/27/22 1355 02/28/22 0451 03/01/22 0557  WBC 6.7 6.2 5.2  NEUTROABS 4.6  --   --   HGB 7.1* 8.4* 8.4*  HCT 22.9* 27.3* 27.4*  MCV 93.9 93.5 93.5  PLT 214 211 664   Basic Metabolic Panel: Recent Labs  Lab 02/27/22 1355 02/28/22 0451 03/01/22 0557  NA 135 135 136  K 4.5 4.4 4.4  CL 102 104 104  CO2 26 23 23   GLUCOSE 91 93 86  BUN 29* 29* 23  CREATININE 1.34* 1.41* 1.18  CALCIUM 9.5 8.9 8.9   GFR: Estimated  Creatinine Clearance: 46.3 mL/min (by C-G formula based on SCr of 1.18 mg/dL). Liver Function Tests: Recent Labs  Lab 02/27/22 1355  AST 15  ALT 11  ALKPHOS 75  BILITOT 0.6  PROT 8.1  ALBUMIN 3.2*   No results for input(s): "LIPASE", "AMYLASE" in the last 168 hours. No results for input(s): "AMMONIA" in the last 168 hours. Coagulation Profile: Recent Labs  Lab 02/27/22 1610  INR 1.4*   Cardiac Enzymes: No results for input(s): "CKTOTAL", "CKMB", "CKMBINDEX", "TROPONINI" in the last 168 hours. BNP (last 3 results) No results for input(s): "PROBNP" in the last 8760 hours. HbA1C: No results for input(s): "HGBA1C" in the last 72 hours. CBG: No results for input(s): "GLUCAP" in the last 168 hours. Lipid Profile: No results for input(s): "CHOL", "HDL", "LDLCALC", "TRIG", "CHOLHDL", "LDLDIRECT" in the last 72 hours. Thyroid Function Tests: No results for input(s): "TSH", "T4TOTAL", "FREET4", "T3FREE", "THYROIDAB" in the last 72 hours. Anemia Panel: No results for input(s): "VITAMINB12", "FOLATE", "FERRITIN", "TIBC", "IRON", "RETICCTPCT" in the last 72 hours. Sepsis Labs: Recent Labs  Lab 02/27/22 1610  LATICACIDVEN 1.2    Recent Results (from the past 240 hour(s))  Resp Panel by RT-PCR (Flu A&B, Covid) Anterior Nasal Swab     Status: None   Collection Time: 02/27/22  4:29 PM   Specimen: Anterior Nasal Swab  Result Value Ref Range Status   SARS Coronavirus 2 by RT PCR NEGATIVE NEGATIVE Final    Comment: (NOTE) SARS-CoV-2 target nucleic acids are NOT DETECTED.  The SARS-CoV-2 RNA is generally detectable in upper respiratory specimens during the acute phase of infection. The lowest concentration of SARS-CoV-2 viral copies this assay can detect is 138 copies/mL. A negative result does not preclude SARS-Cov-2 infection and should not be used as the sole basis for treatment or other patient management decisions. A negative result may occur with  improper specimen  collection/handling, submission of specimen other than nasopharyngeal swab, presence of viral mutation(s) within the areas targeted by this assay, and inadequate number of viral copies(<138 copies/mL). A negative result must be combined with clinical observations, patient history, and epidemiological information. The expected result is Negative.  Fact Sheet for Patients:  EntrepreneurPulse.com.au  Fact Sheet for Healthcare Providers:  IncredibleEmployment.be  This test is no t yet approved or cleared by the [place] FDA and  has been authorized for detection and/or diagnosis of SARS-CoV-2 by FDA under an Emergency Use Authorization (EUA). This EUA will remain  in effect (meaning this test can be used) for the duration of the COVID-19 declaration under Section 564(b)(1) of the Act, 21 U.S.C.section 360bbb-3(b)(1), unless the authorization is terminated  or revoked sooner.       Influenza A by PCR NEGATIVE NEGATIVE Final   Influenza B by PCR NEGATIVE NEGATIVE Final    Comment: (NOTE) The Xpert Xpress SARS-CoV-2/FLU/RSV plus assay is intended as  an aid in the diagnosis of influenza from Nasopharyngeal swab specimens and should not be used as a sole basis for treatment. Nasal washings and aspirates are unacceptable for Xpert Xpress SARS-CoV-2/FLU/RSV testing.  Fact Sheet for Patients: EntrepreneurPulse.com.au  Fact Sheet for Healthcare Providers: IncredibleEmployment.be  This test is not yet approved or cleared by the [place] FDA and has been authorized for detection and/or diagnosis of SARS-CoV-2 by FDA under an Emergency Use Authorization (EUA). This EUA will remain in effect (meaning this test can be used) for the duration of the COVID-19 declaration under Section 564(b)(1) of the Act, 21 U.S.C. section 360bbb-3(b)(1), unless the authorization is terminated or revoked.  Performed at [facility], [place] [address], [place], [place] [address]          Radiology Studies: No results found.      Scheduled Meds:  acetaminophen  1,000 mg Oral Q8H   FLUoxetine  20 mg Oral Daily   fluticasone  2 spray Each Nare Daily   mometasone-formoterol  2 puff Inhalation BID   Continuous Infusions:   LOS: 2 days    Time spent: 35 minutes.     Lorelei Pont, MD [facility]   If 7PM-7AM, please contact night-coverage [email or web address]  03/04/2022, 2:58 PM

## 2022-03-04 NOTE — Progress Notes (Signed)
Mobility Specialist - Progress Note   03/04/22 1439  Mobility  Activity Ambulated with assistance in hallway  Level of Assistance Modified independent, requires aide device or extra time  Assistive Device Front wheel walker  Distance Ambulated (ft) 480 ft  Activity Response Tolerated well  Mobility Referral Yes  $Mobility charge 1 Mobility   Pt received in bed and agreeable to mobility. C?o index finger cramping prior to ambulating.  Pt to guest chair in room after session with all needs met.    [place]  Mobility Specialist  

## 2022-03-04 NOTE — Progress Notes (Signed)
Daily Progress Note   Patient Name: Mitchell Neal       Date: 03/04/2022 DOB: 1940/10/14  Age: 81 y.o. MRN#: [id number] Attending Physician: Lorelei Pont, MD Primary Care Physician: Haywood Pao, MD Admit Date: 02/27/2022 Length of Stay: 2 days  Reason for Consultation/Follow-up: Establishing goals of care  Subjective:   CC: Patient seen laying in bed today.  Denies any symptoms of concern.  Following up regarding complex medical decision making and symptom management.  Subjective:  Patient seen this morning laying in bed.  No symptom concerns at this time.  While patient continues to be interactive, easily confused and forgetful.  Patient unsure if his daughters have been by recently.  Will plan to call them today to check in.  Able to call patient's daughter, Marliss Czar, later in afternoon.  Leigh updated on the difficulties to find placement for patient based on income.  Leigh notes she and her sister are doing everything within their power to make sure that their father stays safe when he leaves the hospital.  They have been touring facilities yet keep coming into roadblocks.  They are planning to further discuss with [facility] on Monday about how much support patient can even get home because he is unsafe to be by himself 24/7. They have also discussed patient's care with a lawyer regarding financial power of attorney and their management. Very difficult situation since patient had never completed any paperwork. Spent time providing emotional support to daughter on phone as could hear how tired she was and exhausted with all she has been doing.  Leigh did note she and her sister will continue to try and fight for the safest care for their father though they are both extensively overwhelmed at this time. She knows patient cannot continue to remain in the hospital. Offered emotional support as able.  Noted palliative medicine team continuing to follow along with patient's journey  and offer support.  Appreciated call today.  Review of Systems Denies any symptoms of concern. Objective:   Vital Signs:  BP 138/69   Pulse 76   Temp 97.6 F (36.4 C) (Oral)   Resp 18   Ht 5\' 9"  (1.753 m)   Wt 66.6 kg   SpO2 100%   BMI 21.68 kg/m   Physical Exam: General: NAD, awake, muscle wasting present Eyes: conjunctiva clear, anicteric sclera HENT: normocephalic, atraumatic, moist mucous membranes Cardiovascular: RRR, no edema in LE b/l Respiratory: no increased work of breathing noted, not in respiratory distress Abdomen: not distended Extremities: Moving all extremities spontaneously Skin: no rashes or lesions on visible skin Neuro: Able to interact though confused Psych: Pleasantly confused  Imaging: I personally reviewed recent imaging.   Assessment & Plan:   Assessment: Patient is an 81 year old male with a past medical history of hypertension, pericardial effusion, and iron deficiency anemia who was admitted on 02/27/2022 for management of worsening shortness of breath and weakness. Of note patient has had worsening weakness and shortness of breath for some time and approximately lost 45 pounds in the past 3 months. Patient had been following with [name]/hematology for worsening normocytic normochromic anemia. Imaging upon admission showed bilateral upper lobe malignancy with extension of the right lobe mass into the right mediastinum and possible invasion of the right tracheal wall. Patient has also been receiving IV fluids for management of AKI. After discussions, patient and family have determined patient would not want to undergo further workup for malignancy and would not want to focus on  patient's comfort moving forward. Palliative medicine team consulted to assist with further complex medical decision making.   Recommendations/Plan: # Complex medical decision making/goals of care:  - Patient remains interactive and at times pleasantly confused. Patient does not  have capacity at time of evaluation to participate in extensive goals of care conversations.                -Had spent extensive time meeting with daughters on 03/01/2022.  Patient transitioned to comfort care focus on 03/01/22.  Daughters continue to actively seek safe discharge plan with hospice support.                  -MOST form completed 03/01/22 and will be scanned into EMR.                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                               -Continue Tylenol 1000 mg scheduled every 8 hours during the day                               -Continue oxycodone 2.5 mg every 4 hours as needed                               -Continue IV Dilaudid 0.2 mg every 2 hours as needed                  -Agitation/anxiety, in the setting of comfort focused care with known bilateral lung masses and likely metastatic cancer                               -Continue Ativan (IV/SL/po) as needed                               -Continue Haldol (IV/SL/po) as needed   # Psycho-social/Spiritual Support:  - Support System: Patient is divorced.  Patient lives alone.  Patient's 2 biological daughters actively involved in supporting care. - Desire for further Chaplain support: Chaplain already following and care notes reviewed   # Discharge Planning: Patient unsafe to return home without 24/7 care.  Daughters actively seeking long-term care placement with hospice support for safe discharge planning.  Continue to appreciate social worker's assistance in this matter.    Discussed with: Patient,  social worker, hospitalist  Thank you for allowing the palliative care team to participate in the care [name].  Chelsea Aus, DO Palliative Care Provider PMT # [phone]  If patient remains symptomatic despite maximum doses, please call PMT at [phone] between 0700 and 1900. Outside of these hours, please call attending, as PMT does not have night coverage.  This provider spent a total of 37 minutes providing patient's care.  Includes review of EMR, discussing  care with other staff members involved in patient's medical care, obtaining relevant history and information from patient and/or patient's family, and personal review of imaging and lab work. Greater than 50% of the time was spent counseling and coordinating care related to the above assessment and plan.

## 2022-03-05 DIAGNOSIS — Z515 Encounter for palliative care: Secondary | ICD-10-CM | POA: Diagnosis not present

## 2022-03-05 DIAGNOSIS — N179 Acute kidney failure, unspecified: Secondary | ICD-10-CM | POA: Diagnosis not present

## 2022-03-05 DIAGNOSIS — R451 Restlessness and agitation: Secondary | ICD-10-CM | POA: Diagnosis not present

## 2022-03-05 DIAGNOSIS — R918 Other nonspecific abnormal finding of lung field: Secondary | ICD-10-CM | POA: Diagnosis not present

## 2022-03-05 NOTE — Progress Notes (Signed)
PLEASE NOTE:  PATIENT DOES NOT WANT TO RECEIVE SERVICES FROM THE MOBILITY SPECIALISTS ANYMORE.

## 2022-03-05 NOTE — TOC Progression Note (Addendum)
Transition of Care [facility]) - Progression Note    Patient Details  Name: Mitchell Neal MRN: [id number] Date of Birth: [birth year]  Transition of Care [facility]) CM/SW Contact  Wilborn Membreno, Juliann Pulse, RN Phone Number: 03/05/2022, 12:57 PM  Clinical Narrative: Left vm w/[facility] hosp[facility] reps Jen/Tim also spoke to Butch Penny who will try to get someone to call me back.   -1:20p-[facility] home hospice rep Jen-able to accept she is awaiting family to secure an ALF. -1:22p-Spoke to dtr Murrell Redden is still looking for an ALF.MD updated.    Expected Discharge Plan: Home w Hospice Care Barriers to Discharge: No Barriers Identified  Expected Discharge Plan and Services Expected Discharge Plan: [place] In-house Referral: Hospice / [place], Chaplain Discharge Planning Services: CM Consult Post Acute Care Choice: Hospice Living arrangements for the past 2 months: Single Family Home                 DME Arranged: N/A DME Agency: NA                   Social Determinants of Health (SDOH) Interventions    Readmission Risk Interventions     No data to display

## 2022-03-05 NOTE — Plan of Care (Signed)

## 2022-03-05 NOTE — Progress Notes (Signed)
Daily Progress Note   Patient Name: Mitchell Neal       Date: 03/05/2022 DOB: 1940-09-08  Age: 81 y.o. MRN#: [id number] Attending Physician: Lorelei Pont, MD Primary Care Physician: Haywood Pao, MD Admit Date: 02/27/2022 Length of Stay: 3 days  Reason for Consultation/Follow-up: Establishing goals of care  Subjective:   CC: Patient seen laying in bed today.  Denies any symptoms of concern.  Following up regarding complex medical decision making and symptom management.  Subjective:  Reviewed EMR.  Informed by bedside RN daughter, Vicente Males, at bedside and wishing to discuss patient's care further.  Presented to bedside to check on patient.  Patient much more alert and interactive today.  Does not remember discussing night sweats yesterday though denies having sweats overnight today.  Patient denied any prominent symptoms of concern.  Was looking forward to lunch. Patient did express wishes to no longer work with mobility tech to walk the halls.  Acknowledged this and noted would let RN know.  Stepped outside to speak to [place] privately.  Her and her Sister Denny Peon continue to diligently work to determine a safe discharge plan for patient.  Spent time answering questions regarding capacity and competency and that while capacity can vary from moment to moment and is a medical term, do not have the authority to assess competency which is a legal term.  Continue to encourage involvement with lawyers about discussing moving forward to best support patient's management.  Vicente Males stated that unfortunately there have not been any facilities that appear to work out for patient so it seems that she and her sister will have to be 24/7 caregivers for her father at this time.  Vicente Males planning to speak with lawyer tomorrow.  Lee planning to speak with [facility] hosp[facility] tomorrow with hopes that hospice could be set up at patient's home with 24/7 supervision by patient's daughters.  Acknowledged difficulties with  situation and offered emotional support as able.  Did encourage continued conversations with hospice social worker regarding planning moving forward as well once patient is out of the hospital.  All questions answered at that time.  Sandrea Hughs for all she is doing for her father.  Review of Systems Denies any symptoms of concern. Objective:   Vital Signs:  BP 136/68 (BP Location: Left Arm)   Pulse 73   Temp 97.8 F (36.6 C) (Oral)   Resp 17   Ht 5\' 9"  (1.753 m)   Wt 66.6 kg   SpO2 97%   BMI 21.68 kg/m   Physical Exam: General: NAD, awake, muscle wasting present Eyes: conjunctiva clear, anicteric sclera HENT: normocephalic, atraumatic, moist mucous membranes Cardiovascular: RRR, no edema in LE b/l Respiratory: no increased work of breathing noted, not in respiratory distress Abdomen: not distended Extremities: Moving all extremities spontaneously Skin: no rashes or lesions on visible skin Neuro: Able to interact though forgetful at times Psych: Pleasantly   Imaging: I personally reviewed recent imaging.   Assessment & Plan:   Assessment: Patient is an 81 year old male with a past medical history of hypertension, pericardial effusion, and iron deficiency anemia who was admitted on 02/27/2022 for management of worsening shortness of breath and weakness. Of note patient has had worsening weakness and shortness of breath for some time and approximately lost 45 pounds in the past 3 months. Patient had been following with [name]/hematology for worsening normocytic normochromic anemia. Imaging upon admission showed bilateral upper lobe malignancy with extension of the right lobe mass into the right mediastinum  and possible invasion of the right tracheal wall. Patient has also been receiving IV fluids for management of AKI. After discussions, patient and family have determined patient would not want to undergo further workup for malignancy and would not want to focus on patient's comfort  moving forward. Palliative medicine team consulted to assist with further complex medical decision making.   Recommendations/Plan: # Complex medical decision making/goals of care:  - Patient denies any symptoms of concern at this time.                -Discussed care with patient's daughter, Vicente Males, privately today as described above in HPI.  Vicente Males and Marliss Czar diligently working to determine safe discharge planning for patient.  They have not found any facilities to accept patient.  At this time seems Vicente Males and Marliss Czar will have to move in with patient to become his 24/7 caregivers.  Vicente Males planning to talk to the lawyer tomorrow morning for further advice. Leigh planning to talk with [facility] home [facility] about further support at home tomorrow.  Courage continued conversations with home hospice social worker to assist in further coordinating care once outside of the hospital.                -MOST form completed 03/01/22.                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                               -Continue Tylenol 1000 mg scheduled every 8 hours during the day                               -Continue oxycodone 2.5 mg every 4 hours as needed                               -Continue IV Dilaudid 0.2 mg every 2 hours as needed                  -Agitation/anxiety, in the setting of comfort focused care with known bilateral lung masses and 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  Patient lives alone.  Patient's 2 biological daughters actively involved in supporting care. - Desire for further Chaplain support: Chaplain already following and care notes reviewed   # Discharge Planning: Daughters have diligently been working to determined safe discharge planning for patient as needs 24/7 care.  At this time no facilities have accepted patient  so patient may have to return home with daughter's being 24/7 caregivers.  Potentially involving [facility] home [facility] for support as well.  Appreciate TOC's assistance with this.    Discussed with: Patient, daughter  Thank you for allowing the palliative care team to participate in the care [name].  Chelsea Aus, DO Palliative Care Provider PMT # [phone]  If patient remains symptomatic despite maximum doses, please call PMT at [phone] between 0700 and 1900. Outside of these hours, please call attending, as PMT does not have night coverage.  This provider spent a total of 39 minutes providing patient's care.  Includes review of EMR, discussing care with other staff members involved in patient's medical care, obtaining relevant history and information from patient and/or patient's family, and personal review of imaging and lab work. Greater than 50% of the time was spent counseling and coordinating care related to the above assessment and plan.

## 2022-03-05 NOTE — Progress Notes (Signed)
PROGRESS NOTE    LAYTON NAVES  ZOX:[id number] DOB: Aug 08, 1940 DOA: 02/27/2022 PCP: Haywood Pao, MD   Brief Narrative: 29 with PMH significant for symptomatic anemia, lung mass, COPD, asthma, hypertension, mediastinal mass, presented with worsening shortness of breath, 45 pound weight loss.  Recently being treated for anemia by heme-onc.  Workup in the ED revealed bilateral upper lobe lung masses with possible invasion's of the trachea and chest wall.  Patient declined further workup and biopsy.  Palliative care and oncology consulted.  He has been transitioned to comfort care as of 03/01/2022.  Currently awaiting disposition, will need long-term care with hospice care.    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  Palliative is on board for the patient does not have capacity to engage in extensive goals of care conversations.  Reports he wants to leave the hospital soon as he can. -Palliative care on board -Oncology consulted -Comfort care orders placed -Awaiting placement.    HTN Patient is normotensive off of all of his hypertensive medications. likely secondary to 45 pound weight loss. Discontinued all BP meds   Normocytic anemia Anemia of chronic disease Hemoglobin stable Patient received 1 unit of PRBC on 02/27/2022   COPD Stable   Goals of care discussion Patient is now comfort care Social work on board for safe discharge plans, may require long-term care with hospice services   Estimated  body mass index is 21.68 kg/m as calculated from the following:   Height as of this encounter: 5\' 9"  (1.753 m).   Weight as of this encounter: 66.6 kg.   DVT prophylaxis: Comfort care, none Code Status: DNR Family Communication: Daughter at bedside.  Disposition Plan:  Status is: Inpatient Remains inpatient appropriate because: awaiting SNF for long term care and hospice care.   Consultants:  Oncology Palliative  Procedures:  None  Antimicrobials:    Subjective: He is alert, reports right hip pain. He will let us know if he needs medicatoin for it.  Reports he wants to get back home or out of the hospital significant.  Objective: Vitals:   03/04/22 1327 03/04/22 1949 03/04/22 2022 03/05/22 0832  BP: 138/69  136/68   Pulse: 76  73   Resp: 18  17   Temp: 97.6 F (36.4 C)  97.8 F (36.6 C)   TempSrc: Oral  Oral   SpO2: 100% 96% 99% 97%  Weight:      Height:        Intake/Output Summary (Last 24 hours) at 03/05/2022 1309 Last data filed at 03/05/2022 0826 Gross per 24 hour  Intake 600 ml  Output --  Net 600 ml   Filed Weights   02/27/22 1523  Weight: 66.6 kg    Examination:  General exam: Appears calm and comfortable  Respiratory system: Clear to auscultation. Respiratory effort normal. Cardiovascular system: S1 & S2 heard, RRR. No JVD, murmurs, rubs, gallops or clicks. No pedal edema. Gastrointestinal system: Abdomen is nondistended, soft and nontender.  No organomegaly or masses felt. Normal bowel sounds heard. Central nervous system: Alert and oriented. No focal neurological deficits. Extremities: Symmetric 5 x 5 power.   Data Reviewed: I have personally reviewed following labs and imaging studies  CBC: Recent Labs  Lab 02/27/22 1355 02/28/22 0451 03/01/22 0557  WBC 6.7 6.2 5.2  NEUTROABS 4.6  --   --   HGB 7.1* 8.4* 8.4*  HCT 22.9* 27.3* 27.4*  MCV 93.9 93.5 93.5  PLT 214 211 951   Basic Metabolic Panel: Recent Labs  Lab 02/27/22 1355  02/28/22 0451 03/01/22 0557  NA 135 135 136  K 4.5 4.4 4.4  CL 102 104 104  CO2 26 23 23   GLUCOSE 91 93 86  BUN 29* 29* 23  CREATININE 1.34* 1.41* 1.18  CALCIUM 9.5 8.9 8.9   GFR: Estimated Creatinine Clearance: 46.3 mL/min (by C-G formula based on SCr of 1.18 mg/dL). Liver Function Tests: Recent Labs  Lab 02/27/22 1355  AST 15  ALT 11  ALKPHOS 75  BILITOT 0.6  PROT 8.1  ALBUMIN 3.2*   No results for input(s): "LIPASE", "AMYLASE" in the last 168 hours. No results for input(s): "AMMONIA" in the last 168 hours. Coagulation Profile: Recent Labs  Lab 02/27/22 1610  INR 1.4*   Cardiac Enzymes: No results for input(s): "CKTOTAL", "CKMB", "CKMBINDEX", "TROPONINI" in the last 168 hours. BNP (last 3 results) No results for input(s): "PROBNP" in the last 8760 hours. HbA1C: No results for input(s): "HGBA1C" in the last 72 hours. CBG: No results for input(s): "GLUCAP" in the last 168 hours. Lipid Profile: No results for input(s): "CHOL", "HDL", "LDLCALC", "TRIG", "CHOLHDL", "LDLDIRECT" in the last 72 hours. Thyroid Function Tests: No results for input(s): "TSH", "T4TOTAL", "FREET4", "T3FREE", "THYROIDAB" in the last 72 hours. Anemia Panel: No results for input(s): "VITAMINB12", "FOLATE", "FERRITIN", "TIBC", "IRON", "RETICCTPCT" in the last 72 hours. Sepsis Labs: Recent Labs  Lab 02/27/22 1610  LATICACIDVEN 1.2    Recent Results (from the past 240 hour(s))  Resp Panel by RT-PCR (Flu A&B, Covid) Anterior Nasal Swab     Status: None   Collection Time: 02/27/22  4:29 PM   Specimen: Anterior Nasal Swab  Result Value Ref Range Status   SARS Coronavirus 2 by RT PCR NEGATIVE NEGATIVE Final    Comment: (NOTE) SARS-CoV-2 target nucleic acids are NOT DETECTED.  The SARS-CoV-2 RNA is generally detectable in upper respiratory specimens during the acute phase of infection. The lowest concentration of SARS-CoV-2 viral copies this assay can detect is 138 copies/mL. A negative  result does not preclude SARS-Cov-2 infection and should not be used as the sole basis for treatment or other patient management decisions. A negative result may occur with  improper specimen collection/handling, submission of specimen other than nasopharyngeal swab, presence of viral mutation(s) within the areas targeted by this assay, and inadequate number of viral copies(<138 copies/mL). A negative result must be combined with clinical observations, patient history, and epidemiological information. The expected result is Negative.  Fact Sheet for Patients:  EntrepreneurPulse.com.au  Fact Sheet for Healthcare Providers:  IncredibleEmployment.be  This test is no t yet approved or cleared by the [place] FDA and  has been authorized for detection and/or diagnosis of SARS-CoV-2 by FDA under an Emergency Use Authorization (EUA). This EUA will remain  in effect (meaning this test can be used) for the duration of the COVID-19 declaration under Section 564(b)(1) of the Act, 21 U.S.C.section 360bbb-3(b)(1), unless the authorization is terminated  or revoked sooner.  Influenza A by PCR NEGATIVE NEGATIVE Final   Influenza B by PCR NEGATIVE NEGATIVE Final    Comment: (NOTE) The Xpert Xpress SARS-CoV-2/FLU/RSV plus assay is intended as an aid in the diagnosis of influenza from Nasopharyngeal swab specimens and should not be used as a sole basis for treatment. Nasal washings and aspirates are unacceptable for Xpert Xpress SARS-CoV-2/FLU/RSV testing.  Fact Sheet for Patients: EntrepreneurPulse.com.au  Fact Sheet for Healthcare Providers: IncredibleEmployment.be  This test is not yet approved or cleared by the [place] FDA and has been authorized for detection and/or diagnosis of SARS-CoV-2 by FDA under an Emergency Use Authorization (EUA). This EUA will remain in effect (meaning this test can be used)  for the duration of the COVID-19 declaration under Section 564(b)(1) of the Act, 21 U.S.C. section 360bbb-3(b)(1), unless the authorization is terminated or revoked.  Performed at [facility], [place] [address], [place], [place] [address]          Radiology Studies: No results found.      Scheduled Meds:  acetaminophen  1,000 mg Oral Q8H   FLUoxetine  20 mg Oral Daily   fluticasone  2 spray Each Nare Daily   mometasone-formoterol  2 puff Inhalation BID   Continuous Infusions:   LOS: 3 days    Time spent: 35 minutes.     Lorelei Pont, MD [facility]   If 7PM-7AM, please contact night-coverage [email or web address]  03/05/2022, 1:09 PM

## 2022-03-06 DIAGNOSIS — D649 Anemia, unspecified: Secondary | ICD-10-CM | POA: Diagnosis not present

## 2022-03-06 NOTE — TOC Progression Note (Signed)
Transition of Care [facility]) - Progression Note    Patient Details  Name: Mitchell Neal MRN: [id number] Date of Birth: May 06, 1940  Transition of Care [facility]) CM/SW [place], LCSW Phone Number: 03/06/2022, 3:01 PM  Clinical Narrative:    Pt and family have agreed to placement at [facility] on [address].  H&P, MAR, FL-2 and addendum forms have been sent to [facility] for review.  Pt's daughter is to sign paperwork this evening for pt to transfer with the understanding pt will be able to transfer to their facility on 12/12. CSW awaiting confirmation from facility.  Pt is active with [facility] for Hospice and will need orders placed for hospice at discharge. Pt will also need order for hospital bed, bedside table, and front wheel walker. MD notified.    Expected Discharge Plan: Home w Hospice Care Barriers to Discharge: No Barriers Identified  Expected Discharge Plan and Services Expected Discharge Plan: [place] In-house Referral: Hospice / [facility], Chaplain Discharge Planning Services: CM Consult Post Acute Care Choice: Hospice Living arrangements for the past 2 months: Single Family Home                 DME Arranged: N/A DME Agency: NA                   Social Determinants of Health (SDOH) Interventions    Readmission Risk Interventions     No data to display

## 2022-03-06 NOTE — Progress Notes (Signed)
  Daily Progress Note   Patient Name: Mitchell Neal       Date: 03/06/2022 DOB: [birth year]  Age: 81 y.o. MRN#: [id number] Attending Physician: Little Ishikawa, MD Primary Care Physician: Haywood Pao, MD Admit Date: 02/27/2022 Length of Stay: 4 days  Patient last seen by this palliative provider on 03/05/22. At that time, patient's goals for care remained focused on comfort. These goals for care have remained the same during patient's hospitalization. Have continued to support and discuss care with daughters. Daughters have been working on safe discharge planning. As per [facility] note today, patient will discharge to [facility] with the support of [facility] hospice. As goals for medical care are currently determined, palliative care team will sign off. Please reach out if our team can be of further assistance in the future. Thank you for involving our team in patient's care.    Chelsea Aus, DO Palliative Care Provider PMT # [phone]

## 2022-03-06 NOTE — NC FL2 (Signed)
[place] LEVEL OF CARE FORM     IDENTIFICATION  Patient Name: Mitchell Neal Birthdate: 1940-12-17 Sex: male Admission Date (Current Location): 02/27/2022  [facility] and [place] Number:  Herbalist and Address:  [facility],  [place], [place]      Provider Number: [id number]  Attending Physician Name and Address:  Little Ishikawa, MD  Relative Name and Phone Number:  Daughter, Jacobi Nile [phone]    Current Level of Care: Hospital Recommended Level of Care: Assisted Living Facility Prior Approval Number:    Date Approved/Denied:   PASRR Number:    Discharge Plan: Other (Comment) (ALF)    Current Diagnoses: Patient Active Problem List   Diagnosis Date Noted   Need for emotional support 03/04/2022   Counseling and coordination of care 03/04/2022   Palliative care encounter 03/01/2022   Dyspnea 03/01/2022   Pain 03/01/2022   Goals of care, counseling/discussion 03/01/2022   High risk medication use 03/01/2022   Agitation 03/01/2022   Symptomatic anemia 02/27/2022   Mediastinal mass 02/27/2022   Lung mass 02/27/2022   AKI (acute kidney injury) ([place]) 02/27/2022   Pericardial effusion 01/10/2022   Essential hypertension 01/10/2022   Iron deficiency anemia 01/10/2022   Normocytic normochromic anemia 01/03/2022   Hypoxia 06/24/2011   COPD (chronic obstructive pulmonary disease) ([place]) 06/24/2011   BPH associated with nocturia 03/09/2011   COPD with asthma 12/08/2010   ERECTILE DYSFUNCTION, 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   HYPERTENSION 10/07/2009    Orientation RESPIRATION BLADDER Height & Weight     Self, Time, Situation, Place  Normal Continent Weight: 146 lb 13.2 oz (66.6 kg) Height:  5\' 9"  (175.3 cm)  BEHAVIORAL SYMPTOMS/MOOD NEUROLOGICAL BOWEL NUTRITION STATUS      Continent Diet (Regular)  AMBULATORY STATUS COMMUNICATION OF NEEDS Skin   Supervision Verbally Normal                        Personal Care Assistance Level of Assistance  Bathing, Feeding, Dressing Bathing Assistance: Independent Feeding assistance: Independent Dressing Assistance: Independent     Functional Limitations Info  Sight, Hearing, Speech Sight Info: Adequate Hearing Info: Adequate Speech Info: Adequate    SPECIAL CARE FACTORS FREQUENCY                       Contractures Contractures Info: Not present    Additional Factors Info  Code Status, Allergies Code Status Info: DNR Allergies Info: Prednisolone           Current Medications (03/06/2022):  This is the current hospital active medication list Current Facility-Administered Medications  Medication Dose Route Frequency Provider Last Rate Last Admin   acetaminophen (TYLENOL) tablet 1,000 mg  1,000 mg Oral Q8H Mims, Lauren W, DO   1,000 mg at 03/05/22 1827   antiseptic oral rinse (BIOTENE) solution 15 mL  15 mL Topical PRN Mims, Lauren W, DO       FLUoxetine (PROZAC) capsule 20 mg  20 mg Oral Daily Tu, Ching T, DO   20 mg at 03/06/22 0941   fluticasone (FLONASE) 50 MCG/ACT nasal spray 2 spray  2 spray Each Nare Daily Tu, Ching T, DO   2 spray at 03/05/22 1029   haloperidol (HALDOL) tablet 0.5 mg  0.5 mg Oral Q4H PRN Mims, Lauren W, DO       Or   haloperidol (HALDOL) 2 MG/ML solution 0.5 mg  0.5 mg Sublingual Q4H PRN  Terrilee Files, DO       Or   haloperidol lactate (HALDOL) injection 0.5 mg  0.5 mg Intravenous Q4H PRN Mims, Lauren W, DO       HYDROmorphone (DILAUDID) injection 0.2 mg  0.2 mg Intravenous Q2H PRN Mims, Lauren W, DO       LORazepam (ATIVAN) tablet 1 mg  1 mg Oral Q4H PRN Mims, Lauren W, DO       Or   LORazepam (ATIVAN) 2 MG/ML concentrated solution 1 mg  1 mg Sublingual Q4H PRN Mims, Lauren W, DO       Or   LORazepam (ATIVAN) injection 1 mg  1 mg Intravenous Q4H PRN Mims, Lauren W, DO       mometasone-formoterol (DULERA) 200-5 MCG/ACT inhaler 2 puff  2 puff Inhalation BID Tu, Ching T, DO   2 puff at 03/06/22  0941   ondansetron (ZOFRAN-ODT) disintegrating tablet 4 mg  4 mg Oral Q6H PRN Terrilee Files, DO       Or   ondansetron (ZOFRAN) injection 4 mg  4 mg Intravenous Q6H PRN Mims, Lauren W, DO       oxyCODONE (Oxy IR/ROXICODONE) immediate release tablet 2.5 mg  2.5 mg Oral Q4H PRN Mims, Lauren W, DO       polyvinyl alcohol (LIQUIFILM TEARS) 1.4 % ophthalmic solution 1 drop  1 drop Both Eyes QID PRN Terrilee Files, DO         Discharge Medications: Please see discharge summary for a list of discharge medications.  Relevant Imaging Results:  Relevant Lab Results:   Additional Information SSN: [id number]  Vassie Moselle, LCSW

## 2022-03-06 NOTE — Progress Notes (Addendum)
PROGRESS NOTE    Mitchell Neal  IPJ:[id number] DOB: March 22, 1941 DOA: 02/27/2022 PCP: Haywood Pao, MD   Brief Narrative: 80 with PMH significant for symptomatic anemia, lung mass, COPD, asthma, hypertension, mediastinal mass, presented with worsening shortness of breath, 45 pound weight loss.  Recently being treated for anemia by heme-onc.  Workup in the ED revealed bilateral upper lobe lung masses with possible invasion's of the trachea and chest wall.  Patient declined further workup and biopsy.  Palliative care and oncology consulted.  He has been transitioned to comfort care as of 03/01/2022.  Medically stable for discharge - awaiting disposition location, will need long-term care with hospice care.   There has been some confusion about patient's mental status/decision making.  He is currently awake alert oriented and able to make medical decisions without any delay.  In regards to patient's competency (legal term unrelated to medical care) to make non-medical decisions about his property, finances, or living arrangements - that is not a medical decision and thus I have no ability to comment on his legal competency to transfer any power over property/assets.  If he wishes to make one person, or multiple people his healthcare power of attorney to further assist with *medical* decisions moving forward he currently has the capacity to do so.  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  Palliative is on board for the  patient does not have capacity to engage in extensive goals of care conversations.  Reports he wants to leave the hospital soon as he can. -Palliative care on board -Oncology consulted -Comfort care orders placed -Awaiting placement.    HTN Patient is normotensive off of all of his hypertensive medications. likely secondary to 45 pound weight loss. Discontinued all BP meds   Normocytic anemia Anemia of chronic disease Hemoglobin stable Patient received 1 unit of PRBC on 02/27/2022   COPD Stable   Goals of care discussion Patient is now comfort care Social work on board for safe discharge plans, may require long-term care with hospice services   Estimated body mass index is 21.68 kg/m as calculated from the following:   Height as of this encounter: 5\' 9"  (1.753 m).   Weight as of this encounter: 66.6 kg.   DVT prophylaxis: Comfort care, none Code Status: DNR Family Communication: Daughter at bedside.  Disposition Plan:  Status is: Inpatient Remains inpatient appropriate because: awaiting SNF for long term care and hospice care.   Consultants:  Oncology Palliative  Procedures:  None  Antimicrobials:    Subjective: No acute issues or events overnight, remains in good spirits otherwise ready for discharge, anxiously awaiting disposition  Objective: Vitals:   03/04/22 1949 03/04/22 2022 03/05/22 0832 03/05/22 1659  BP:  136/68  (!) 175/68  Pulse:  73  80  Resp:  17  20  Temp:  97.8 F (36.6 C)  98 F (36.7 C)  TempSrc:  Oral  Oral  SpO2: 96% 99% 97% 97%  Weight:  Height:        Intake/Output Summary (Last 24 hours) at 03/06/2022 0818 Last data filed at 03/05/2022 0826 Gross per 24 hour  Intake 240 ml  Output --  Net 240 ml    Filed Weights   02/27/22 1523  Weight: 66.6 kg    Examination:  General:  Pleasantly resting in bed, No acute distress. HEENT:  Normocephalic atraumatic.  Sclerae nonicteric, noninjected.  Extraocular movements intact  bilaterally. Neck:  Without mass or deformity.  Trachea is midline. Lungs:  Clear to auscultate bilaterally without rhonchi, wheeze, or rales. Heart:  Regular rate and rhythm.  Without murmurs, rubs, or gallops. Abdomen:  Soft, nontender, nondistended.  Without guarding or rebound.  Data Reviewed: I have personally reviewed following labs and imaging studies  CBC: Recent Labs  Lab 02/27/22 1355 02/28/22 0451 03/01/22 0557  WBC 6.7 6.2 5.2  NEUTROABS 4.6  --   --   HGB 7.1* 8.4* 8.4*  HCT 22.9* 27.3* 27.4*  MCV 93.9 93.5 93.5  PLT 214 211 322    Basic Metabolic Panel: Recent Labs  Lab 02/27/22 1355 02/28/22 0451 03/01/22 0557  NA 135 135 136  K 4.5 4.4 4.4  CL 102 104 104  CO2 26 23 23   GLUCOSE 91 93 86  BUN 29* 29* 23  CREATININE 1.34* 1.41* 1.18  CALCIUM 9.5 8.9 8.9    GFR: Estimated Creatinine Clearance: 46.3 mL/min (by C-G formula based on SCr of 1.18 mg/dL). Liver Function Tests: Recent Labs  Lab 02/27/22 1355  AST 15  ALT 11  ALKPHOS 75  BILITOT 0.6  PROT 8.1  ALBUMIN 3.2*    No results for input(s): "LIPASE", "AMYLASE" in the last 168 hours. No results for input(s): "AMMONIA" in the last 168 hours. Coagulation Profile: Recent Labs  Lab 02/27/22 1610  INR 1.4*    Cardiac Enzymes: No results for input(s): "CKTOTAL", "CKMB", "CKMBINDEX", "TROPONINI" in the last 168 hours. BNP (last 3 results) No results for input(s): "PROBNP" in the last 8760 hours. HbA1C: No results for input(s): "HGBA1C" in the last 72 hours. CBG: No results for input(s): "GLUCAP" in the last 168 hours. Lipid Profile: No results for input(s): "CHOL", "HDL", "LDLCALC", "TRIG", "CHOLHDL", "LDLDIRECT" in the last 72 hours. Thyroid Function Tests: No results for input(s): "TSH", "T4TOTAL", "FREET4", "T3FREE", "THYROIDAB" in the last 72 hours. Anemia Panel: No results for input(s): "VITAMINB12", "FOLATE", "FERRITIN", "TIBC", "IRON", "RETICCTPCT" in the last 72 hours. Sepsis  Labs: Recent Labs  Lab 02/27/22 1610  LATICACIDVEN 1.2     Recent Results (from the past 240 hour(s))  Resp Panel by RT-PCR (Flu A&B, Covid) Anterior Nasal Swab     Status: None   Collection Time: 02/27/22  4:29 PM   Specimen: Anterior Nasal Swab  Result Value Ref Range Status   SARS Coronavirus 2 by RT PCR NEGATIVE NEGATIVE Final    Comment: (NOTE) SARS-CoV-2 target nucleic acids are NOT DETECTED.  The SARS-CoV-2 RNA is generally detectable in upper respiratory specimens during the acute phase of infection. The lowest concentration of SARS-CoV-2 viral copies this assay can detect is 138 copies/mL. A negative result does not preclude SARS-Cov-2 infection and should not be used as the sole basis for treatment or other patient management decisions. A negative result may occur with  improper specimen collection/handling, submission of specimen other than nasopharyngeal swab, presence of viral mutation(s) within the areas targeted by this assay, and inadequate number of viral copies(<138 copies/mL). A negative result must be combined with  clinical observations, patient history, and epidemiological information. The expected result is Negative.  Fact Sheet for Patients:  EntrepreneurPulse.com.au  Fact Sheet for Healthcare Providers:  IncredibleEmployment.be  This test is no t yet approved or cleared by the [place] FDA and  has been authorized for detection and/or diagnosis of SARS-CoV-2 by FDA under an Emergency Use Authorization (EUA). This EUA will remain  in effect (meaning this test can be used) for the duration of the COVID-19 declaration under Section 564(b)(1) of the Act, 21 U.S.C.section 360bbb-3(b)(1), unless the authorization is terminated  or revoked sooner.       Influenza A by PCR NEGATIVE NEGATIVE Final   Influenza B by PCR NEGATIVE NEGATIVE Final    Comment: (NOTE) The Xpert Xpress SARS-CoV-2/FLU/RSV plus assay is  intended as an aid in the diagnosis of influenza from Nasopharyngeal swab specimens and should not be used as a sole basis for treatment. Nasal washings and aspirates are unacceptable for Xpert Xpress SARS-CoV-2/FLU/RSV testing.  Fact Sheet for Patients: EntrepreneurPulse.com.au  Fact Sheet for Healthcare Providers: IncredibleEmployment.be  This test is not yet approved or cleared by the [place] FDA and has been authorized for detection and/or diagnosis of SARS-CoV-2 by FDA under an Emergency Use Authorization (EUA). This EUA will remain in effect (meaning this test can be used) for the duration of the COVID-19 declaration under Section 564(b)(1) of the Act, 21 U.S.C. section 360bbb-3(b)(1), unless the authorization is terminated or revoked.  Performed at [facility], [place] [address], [place], [place] [address]          Radiology Studies: No results found.      Scheduled Meds:  acetaminophen  1,000 mg Oral Q8H   FLUoxetine  20 mg Oral Daily   fluticasone  2 spray Each Nare Daily   mometasone-formoterol  2 puff Inhalation BID   Continuous Infusions:   LOS: 4 days    Time spent: 35 minutes.     Little Ishikawa, MD [facility]   If 7PM-7AM, please contact night-coverage [email or web address]  03/06/2022, 8:18 AM

## 2022-03-07 DIAGNOSIS — D649 Anemia, unspecified: Secondary | ICD-10-CM | POA: Diagnosis not present

## 2022-03-07 MED ORDER — ONDANSETRON 4 MG PO TBDP: 4.0000 mg | ORAL_TABLET | Freq: Four times a day (QID) | ORAL | 0 refills | Status: AC | PRN

## 2022-03-07 MED ORDER — OXYCODONE HCL 5 MG PO TABS: 2.5000 mg | ORAL_TABLET | ORAL | 0 refills | Status: AC | PRN

## 2022-03-07 MED ORDER — BIOTENE DRY MOUTH MT LIQD: 15.0000 mL | OROMUCOSAL | 0 refills | Status: AC | PRN

## 2022-03-07 MED ORDER — POLYVINYL ALCOHOL 1.4 % OP SOLN: 1.0000 [drp] | Freq: Four times a day (QID) | OPHTHALMIC | 0 refills | Status: AC | PRN

## 2022-03-07 NOTE — Discharge Summary (Signed)
Physician Discharge Summary  SNYDER COLAVITO WUJ:[id number] DOB: 1941/01/18 DOA: 02/27/2022  PCP: Haywood Pao, MD  Admit date: 02/27/2022 Discharge date: 03/07/2022  Admitted From: Home Disposition:  SNF  Recommendations for Outpatient Follow-up:  Follow up w/ palliative  Discharge Condition:Stable  CODE STATUS:DNR  Diet recommendation: As tolerated    Brief/Interim Summary: 43 with PMH significant for symptomatic anemia, lung mass, COPD, asthma, hypertension, mediastinal mass, presented with worsening shortness of breath, 45 pound weight loss.  Recently being treated for anemia by heme-onc.  Workup in the ED revealed bilateral upper lobe lung masses with possible invasion's of the trachea and chest wall.  Patient declined further workup and biopsy.  Palliative care and oncology consulted.  He has been transitioned to comfort care as of 03/01/2022.   Medically stable for discharge - awaiting disposition location, will need long-term care with hospice care.   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   Discharge with palliative follow up to facility as previously planned w/ family   HTN Patient is normotensive off of all of his hypertensive medications.    Normocytic anemia Anemia of chronic disease Hemoglobin stable Patient received 1 unit of PRBC on 02/27/2022 No further labs given palliative care ongoing   COPD Stable   Goals of care discussion Patient is now comfort care Social work on  board for safe discharge plans, may require long-term care with hospice services  Discharge Instructions  Discharge Instructions     Ambulatory referral to Hospice   Complete by: As directed    Ambulatory referral to Hospice   Complete by: As directed    Will need the following DME: -Hospital bed -Bedside table -[place]   Ambulatory referral to Hospice   Complete by: As directed    Will need the following DME: Hospital Bed Over bed table Front wheel walker   Discharge patient   Complete by: As directed    Discharge disposition: 03-[facility]illed [place]   Discharge patient date: 03/07/2022      Allergies as of 03/07/2022       Reactions   Prednisolone    unknown        Medication List     STOP taking these medications    albuterol 108 (90 Base) MCG/ACT inhaler Commonly known as: VENTOLIN HFA   furosemide 20 MG tablet Commonly known as: LASIX   labetalol 200 MG tablet Commonly known as: NORMODYNE   labetalol 300 MG tablet Commonly known as: NORMODYNE   losartan 50 MG tablet Commonly known as: COZAAR   potassium chloride 10 MEQ tablet Commonly known as: KLOR-CON   valsartan 160 MG tablet Commonly known as: DIOVAN       TAKE these medications    Advair Diskus 250-50 MCG/ACT Aepb Generic drug: fluticasone-salmeterol INHALE ONE DOSE BY MOUTH TWICE DAILY What changed: when to take this   antiseptic oral rinse Liqd Apply 15 mLs topically as needed for dry mouth.   FLUoxetine 20 MG capsule  Commonly known as: PROZAC Take 20 mg by mouth daily.   fluticasone 50 MCG/ACT nasal spray Commonly known as: FLONASE Place 2 sprays into both nostrils daily.   ondansetron 4 MG disintegrating tablet Commonly known as: ZOFRAN-ODT Take 1 tablet (4 mg total) by mouth every 6 (six) hours as needed for nausea.   oxyCODONE 5 MG immediate release tablet Commonly known as: Oxy IR/ROXICODONE Take 0.5 tablets (2.5 mg total) by mouth every 4 (four)  hours as needed for moderate pain or severe pain (dyspnea).   polyvinyl alcohol 1.4 % ophthalmic solution Commonly known as: LIQUIFILM TEARS Place 1 drop into both eyes 4 (four) times daily as needed for dry eyes.   VITAMIN B-12 PO Take 1 tablet by mouth daily.               Durable Medical Equipment  (From admission, onward)           Start     Ordered   03/06/22 1347  [place] A  ([place])  Once       Question Answer Comment  Walker: With 5 Inch Wheels   Bedside commode: 3 in 1 Bedside Commode   Fully Electric Hospital Bed: Full Rails   Wheelchair: Light Weight   Overbed table Overbed Table   Tub Seat: Back      03/06/22 1346            Follow-up Information     [facility] Follow up.   Specialty: Pulmonology Why: Call if you and your oncologist would like to pursue biopsy.  Dr. Micah Flesher information: [place] [phone]               Allergies  Allergen Reactions   Prednisolone     unknown    Consultations: Oncology, palliative   Procedures/Studies: CT Angio Chest PE W/Cm &/Or Wo Cm  Result Date: 02/27/2022 CLINICAL DATA:  Shortness of breath.  Concern for malignancy. EXAM: CT ANGIOGRAPHY CHEST WITH CONTRAST TECHNIQUE: Multidetector CT imaging of the chest was performed using the standard protocol during bolus administration of intravenous contrast. Multiplanar CT image reconstructions and MIPs were obtained to evaluate the vascular anatomy. RADIATION DOSE REDUCTION: This exam was performed according to the departmental dose-optimization program which includes automated exposure control, adjustment of the mA and/or kV according to patient size and/or use of iterative reconstruction technique. CONTRAST:  149mL OMNIPAQUE IOHEXOL 350 MG/ML SOLN COMPARISON:  Earlier radiograph dated 02/27/2022. FINDINGS: Cardiovascular: There is no  cardiomegaly. Pericardial effusion measures 9 mm in thickness anterior to the heart. Advanced atherosclerotic calcification of the LAD and left coronary artery. Moderate atherosclerotic calcification of the thoracic aorta. No aneurysmal dilatation or dissection. Evaluation of the pulmonary arteries is limited due to respiratory motion. No pulmonary artery embolus identified. Mediastinum/Nodes: Subcarinal adenopathy measures 3 cm in short axis. Right hilar adenopathy measures 14 mm in short axis. There is infiltrative mass in the right upper mediastinum and paratracheal region measures approximately 5.5 x 8.2 cm in axial dimensions. The mass appears to abut the trachea with probable invasion of the right lateral tracheal wall. The mass also abuts the right lateral aspect of the aortic arch. Mildly rounded bilateral supraclavicular lymph nodes. Lungs/Pleura: Left upper lobe mass abuts the fissure and left lateral pleural surface and extends into the left hilum. This mass measures approximately 6 x 5 cm in axial dimensions. Right upper lobe spiculated mass measures 3.7 x  4.6 cm and contiguous with the described mediastinal infiltrative mass. No pleural effusion or pneumothorax. The central airways remain patent. Upper Abdomen: Gallstones. Indeterminate 3 cm splenic hypodense lesion. Musculoskeletal: Subcentimeter sclerotic focus involving T1 vertebra, nonspecific. No acute osseous pathology. Review of the MIP images confirms the above findings. IMPRESSION: 1. No CT evidence of pulmonary artery embolus. 2. 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 There is probable invasion of the mass into the right tracheal wall. 3. Mediastinal and right hilar adenopathy. 4. Cholelithiasis. 5. Indeterminate 3 cm splenic hypodense lesion. 6. Advanced atherosclerotic calcification of the LAD and left coronary artery. 7.  Aortic Atherosclerosis (ICD10-I70.0). Electronically  Signed   By: Anner Crete M.D.   On: 02/27/2022 18:44   DG Chest Port 1 View  Result Date: 02/27/2022 CLINICAL DATA:  Shortness of breath. EXAM: PORTABLE CHEST 1 VIEW COMPARISON:  Chest radiograph dated 06/23/2011. FINDINGS: Rounded opacity in the left upper lung field measures approximately 7 x 7 cm and may represent pneumonia with concerning for a mass. Further evaluation with CT is recommended. Additional rounded density in the right upper lobe measures 3 x 5 cm. There is no pleural effusion or pneumothorax. The cardiac silhouette is within normal limits. There is thickening of the right paratracheal soft tissues which may represent adenopathy or infiltrative mass. No acute osseous pathology. IMPRESSION: Bilateral upper lobe opacities concerning for neoplasm. Further evaluation with CT is recommended. Electronically Signed   By: Anner Crete M.D.   On: 02/27/2022 17:57     Subjective: No acute issue/events overnight   Discharge Exam: Vitals:   03/06/22 2055 03/07/22 0800  BP: (!) 154/78   Pulse: 90   Resp: 16   Temp: (!) 97.3 F (36.3 C)   SpO2: 98% 98%   Vitals:   03/05/22 1659 03/06/22 1351 03/06/22 2055 03/07/22 0800  BP: (!) 175/68 (!) 147/67 (!) 154/78   Pulse: 80 88 90   Resp: 20 16 16    Temp: 98 F (36.7 C) 97.8 F (36.6 C) (!) 97.3 F (36.3 C)   TempSrc: Oral Oral Oral   SpO2: 97% 98% 98% 98%  Weight:      Height:        General: Pt is alert, awake, not in acute distress Cardiovascular: RRR, S1/S2 +, no rubs, no gallops Respiratory: CTA bilaterally, no wheezing, no rhonchi Abdominal: Soft, NT, ND, bowel sounds + Extremities: no edema, no cyanosis    The results of significant diagnostics from this hospitalization (including imaging, microbiology, ancillary and laboratory) are listed below for reference.     Microbiology: Recent Results (from the past 240 hour(s))  Resp Panel by RT-PCR (Flu A&B, Covid) Anterior Nasal Swab     Status: None    Collection Time: 02/27/22  4:29 PM   Specimen: Anterior Nasal Swab  Result Value Ref Range Status   SARS Coronavirus 2 by RT PCR NEGATIVE NEGATIVE Final    Comment: (NOTE) SARS-CoV-2 target nucleic acids are NOT DETECTED.  The SARS-CoV-2 RNA is generally detectable in upper respiratory specimens during the acute phase of infection. The lowest concentration of SARS-CoV-2 viral copies this assay can detect is 138 copies/mL. A negative result does not preclude SARS-Cov-2 infection and should not be used as the sole basis for treatment or other patient management decisions. A negative result may occur with  improper specimen collection/handling, submission of specimen other than nasopharyngeal swab, presence of viral mutation(s) within the areas targeted  by this assay, and inadequate number of viral copies(<138 copies/mL). A negative result must be combined with clinical observations, patient history, and epidemiological information. The expected result is Negative.  Fact Sheet for Patients:  EntrepreneurPulse.com.au  Fact Sheet for Healthcare Providers:  IncredibleEmployment.be  This test is no t yet approved or cleared by the [place] FDA and  has been authorized for detection and/or diagnosis of SARS-CoV-2 by FDA under an Emergency Use Authorization (EUA). This EUA will remain  in effect (meaning this test can be used) for the duration of the COVID-19 declaration under Section 564(b)(1) of the Act, 21 U.S.C.section 360bbb-3(b)(1), unless the authorization is terminated  or revoked sooner.       Influenza A by PCR NEGATIVE NEGATIVE Final   Influenza B by PCR NEGATIVE NEGATIVE Final    Comment: (NOTE) The Xpert Xpress SARS-CoV-2/FLU/RSV plus assay is intended as an aid in the diagnosis of influenza from Nasopharyngeal swab specimens and should not be used as a sole basis for treatment. Nasal washings and aspirates are unacceptable for  Xpert Xpress SARS-CoV-2/FLU/RSV testing.  Fact Sheet for Patients: EntrepreneurPulse.com.au  Fact Sheet for Healthcare Providers: IncredibleEmployment.be  This test is not yet approved or cleared by the [place] FDA and has been authorized for detection and/or diagnosis of SARS-CoV-2 by FDA under an Emergency Use Authorization (EUA). This EUA will remain in effect (meaning this test can be used) for the duration of the COVID-19 declaration under Section 564(b)(1) of the Act, 21 U.S.C. section 360bbb-3(b)(1), unless the authorization is terminated or revoked.  Performed at [facility], [place] [address], [place], [place] [address]      Labs: BNP (last 3 results) Recent Labs    02/27/22 1610  BNP 782.4*   Basic Metabolic Panel: Recent Labs  Lab 03/01/22 0557  NA 136  K 4.4  CL 104  CO2 23  GLUCOSE 86  BUN 23  CREATININE 1.18  CALCIUM 8.9   Liver Function Tests: No results for input(s): "AST", "ALT", "ALKPHOS", "BILITOT", "PROT", "ALBUMIN" in the last 168 hours. No results for input(s): "LIPASE", "AMYLASE" in the last 168 hours. No results for input(s): "AMMONIA" in the last 168 hours. CBC: Recent Labs  Lab 03/01/22 0557  WBC 5.2  HGB 8.4*  HCT 27.4*  MCV 93.5  PLT 183   Cardiac Enzymes: No results for input(s): "CKTOTAL", "CKMB", "CKMBINDEX", "TROPONINI" in the last 168 hours. BNP: Invalid input(s): "POCBNP" CBG: No results for input(s): "GLUCAP" in the last 168 hours. D-Dimer No results for input(s): "DDIMER" in the last 72 hours. Hgb A1c No results for input(s): "HGBA1C" in the last 72 hours. Lipid Profile No results for input(s): "CHOL", "HDL", "LDLCALC", "TRIG", "CHOLHDL", "LDLDIRECT" in the last 72 hours. Thyroid function studies No results for input(s): "TSH", "T4TOTAL", "T3FREE", "THYROIDAB" in the last 72 hours.  Invalid input(s): "FREET3" Anemia work up No results for input(s):  "VITAMINB12", "FOLATE", "FERRITIN", "TIBC", "IRON", "RETICCTPCT" in the last 72 hours. Urinalysis    Component Value Date/Time   COLORURINE AMBER (A) 02/27/2022 2114   APPEARANCEUR CLEAR 02/27/2022 2114   LABSPEC >1.046 (H) 02/27/2022 2114   PHURINE 5.0 02/27/2022 2114   GLUCOSEU NEGATIVE 02/27/2022 2114   HGBUR LARGE (A) 02/27/2022 2114   HGBUR negative 10/07/2009 0750   BILIRUBINUR NEGATIVE 02/27/2022 2114   BILIRUBINUR n 11/10/2010 1147   KETONESUR NEGATIVE 02/27/2022 2114   PROTEINUR NEGATIVE 02/27/2022 2114   UROBILINOGEN 0.2 11/10/2010 1147   UROBILINOGEN 0.2 10/07/2009 0750  NITRITE NEGATIVE 02/27/2022 2114   LEUKOCYTESUR NEGATIVE 02/27/2022 2114   Sepsis Labs Recent Labs  Lab 03/01/22 0557  WBC 5.2   Microbiology Recent Results (from the past 240 hour(s))  Resp Panel by RT-PCR (Flu A&B, Covid) Anterior Nasal Swab     Status: None   Collection Time: 02/27/22  4:29 PM   Specimen: Anterior Nasal Swab  Result Value Ref Range Status   SARS Coronavirus 2 by RT PCR NEGATIVE NEGATIVE Final    Comment: (NOTE) SARS-CoV-2 target nucleic acids are NOT DETECTED.  The SARS-CoV-2 RNA is generally detectable in upper respiratory specimens during the acute phase of infection. The lowest concentration of SARS-CoV-2 viral copies this assay can detect is 138 copies/mL. A negative result does not preclude SARS-Cov-2 infection and should not be used as the sole basis for treatment or other patient management decisions. A negative result may occur with  improper specimen collection/handling, submission of specimen other than nasopharyngeal swab, presence of viral mutation(s) within the areas targeted by this assay, and inadequate number of viral copies(<138 copies/mL). A negative result must be combined with clinical observations, patient history, and epidemiological information. The expected result is Negative.  Fact Sheet for Patients:   EntrepreneurPulse.com.au  Fact Sheet for Healthcare Providers:  IncredibleEmployment.be  This test is no t yet approved or cleared by the [place] FDA and  has been authorized for detection and/or diagnosis of SARS-CoV-2 by FDA under an Emergency Use Authorization (EUA). This EUA will remain  in effect (meaning this test can be used) for the duration of the COVID-19 declaration under Section 564(b)(1) of the Act, 21 U.S.C.section 360bbb-3(b)(1), unless the authorization is terminated  or revoked sooner.       Influenza A by PCR NEGATIVE NEGATIVE Final   Influenza B by PCR NEGATIVE NEGATIVE Final    Comment: (NOTE) The Xpert Xpress SARS-CoV-2/FLU/RSV plus assay is intended as an aid in the diagnosis of influenza from Nasopharyngeal swab specimens and should not be used as a sole basis for treatment. Nasal washings and aspirates are unacceptable for Xpert Xpress SARS-CoV-2/FLU/RSV testing.  Fact Sheet for Patients: EntrepreneurPulse.com.au  Fact Sheet for Healthcare Providers: IncredibleEmployment.be  This test is not yet approved or cleared by the [place] FDA and has been authorized for detection and/or diagnosis of SARS-CoV-2 by FDA under an Emergency Use Authorization (EUA). This EUA will remain in effect (meaning this test can be used) for the duration of the COVID-19 declaration under Section 564(b)(1) of the Act, 21 U.S.C. section 360bbb-3(b)(1), unless the authorization is terminated or revoked.  Performed at [facility], [place] [address] [place], [place] [address]      Time coordinating discharge: Over 30 minutes  SIGNED:   Little Ishikawa, DO [facility] 03/07/2022, 10:56 AM Pager   If 7PM-7AM, please contact night-coverage [email or web address]

## 2022-03-07 NOTE — Plan of Care (Signed)
  Problem: Education: Goal: Knowledge of General Education information will improve Description: Including pain rating scale, medication(s)/side effects and non-pharmacologic comfort measures Outcome: Adequate for Discharge   Problem: Health Behavior/Discharge Planning: Goal: Ability to manage health-related needs will improve Outcome: Adequate for Discharge   Problem: Clinical Measurements: Goal: Ability to maintain clinical measurements within normal limits will improve Outcome: Adequate for Discharge Goal: Will remain free from infection Outcome: Adequate for Discharge Goal: Diagnostic test results will improve Outcome: Adequate for Discharge Goal: Respiratory complications will improve Outcome: Adequate for Discharge Goal: Cardiovascular complication will be avoided Outcome: Adequate for Discharge   Problem: Activity: Goal: Risk for activity intolerance will decrease Outcome: Adequate for Discharge   Problem: Nutrition: Goal: Adequate nutrition will be maintained Outcome: Adequate for Discharge   Problem: Coping: Goal: Level of anxiety will decrease Outcome: Adequate for Discharge   Problem: Elimination: Goal: Will not experience complications related to bowel motility Outcome: Adequate for Discharge Goal: Will not experience complications related to urinary retention Outcome: Adequate for Discharge   Problem: Pain Managment: Goal: General experience of comfort will improve Outcome: Adequate for Discharge   Problem: Safety: Goal: Ability to remain free from injury will improve Outcome: Adequate for Discharge   Problem: Skin Integrity: Goal: Risk for impaired skin integrity will decrease Outcome: Adequate for Discharge   Problem: Education: Goal: Knowledge of the prescribed therapeutic regimen will improve Outcome: Adequate for Discharge   Problem: Coping: Goal: Ability to identify and develop effective coping behavior will improve Outcome: Adequate for  Discharge   Problem: Clinical Measurements: Goal: Quality of life will improve Outcome: Adequate for Discharge   Problem: Respiratory: Goal: Verbalizations of increased ease of respirations will increase Outcome: Adequate for Discharge   Problem: Role Relationship: Goal: Family's ability to cope with current situation will improve Outcome: Adequate for Discharge Goal: Ability to verbalize concerns, feelings, and thoughts to partner or family member will improve Outcome: Adequate for Discharge   Problem: Pain Management: Goal: Satisfaction with pain management regimen will improve Outcome: Adequate for Discharge

## 2022-03-07 NOTE — TOC Transition Note (Signed)
Transition of Care [facility]) - CM/SW Discharge Note   Patient Details  Name: Mitchell Neal MRN: [id number] Date of Birth: 02/20/1941  Transition of Care [facility]) CM/SW Contact:  Vassie Moselle, [place] Phone Number: 03/07/2022, 12:51 PM   Clinical Narrative:    Pt is to transfer to [facility] [address] and will be receiving hospice services through [facility]. Hospital bed, overbed table, and RW have been ordered through hospice and are to be delivered to ALF prior to pt transferring. Pt will be going to room 39. RN to call report to [place] at [phone]. Pt's daughter Marliss Czar is to transport pt to facility.    Final next level of care: Assisted Living (w/ Hospice services) Barriers to Discharge: No Barriers Identified   Patient Goals and CMS Choice Patient states their goals for this hospitalization and ongoing recovery are:: For pt to go to a facility w/ hospice CMS Medicare.gov Compare Post Acute Care list provided to:: Patient Choice offered to / list presented to : Patient, Adult Children  Discharge Placement              Patient chooses bed at: Other - please specify in the comment section below: ([facility]) Patient to be transferred to facility by: Daughter Name of family member notified: Leigh Patient and family notified of of transfer: 03/07/22  Discharge Plan and Services In-house Referral: Hospice / [facility] [place], Chaplain Discharge Planning Services: CM Consult Post Acute Care Choice: Hospice          DME Arranged: Walker rolling, Hospital bed, Overbed table DME Agency: Other - Comment ([place]) Date DME Agency Contacted: 03/07/22 Time DME Agency Contacted: 1250 Representative spoke with at DME Agency: [place] Determinants of Health ([place]) Interventions     Readmission Risk Interventions    03/07/2022   12:49 PM  Readmission Risk Prevention Plan  Post Dischage Appt Complete  Medication Screening Complete   Transportation Screening Complete

## 2022-03-07 NOTE — Care Management Important Message (Signed)
Important Message  Patient Details IM Letter given Name: CAVION FAIOLA MRN: [id number] Date of Birth: 12/20/1940   Medicare Important Message Given:  Yes     Kerin Salen 03/07/2022, 11:01 AM

## 2022-03-09 ENCOUNTER — Ambulatory Visit: Payer: Medicare Other | Admitting: Hematology and Oncology

## 2022-03-10 ENCOUNTER — Ambulatory Visit: Payer: Medicare Other | Admitting: Nurse Practitioner

## 2022-04-27 DEATH — deceased
